# Patient Record
Sex: Male | Born: 1970 | Race: Black or African American | Hispanic: No | Smoking: Never smoker
Health system: Southern US, Community
[De-identification: ages and names within clinical notes are randomized; demographics above are authoritative.]

## PROBLEM LIST (undated history)

## (undated) DIAGNOSIS — I10 Essential (primary) hypertension: Secondary | ICD-10-CM

## (undated) DIAGNOSIS — C61 Malignant neoplasm of prostate: Secondary | ICD-10-CM

## (undated) DIAGNOSIS — E785 Hyperlipidemia, unspecified: Secondary | ICD-10-CM

## (undated) DIAGNOSIS — K219 Gastro-esophageal reflux disease without esophagitis: Secondary | ICD-10-CM

## (undated) HISTORY — PX: HAND SURGERY: SHX662

## (undated) HISTORY — PX: PROSTATE BIOPSY: SHX241

---

## 2007-05-18 ENCOUNTER — Emergency Department (HOSPITAL_COMMUNITY): Admission: EM | Admit: 2007-05-18 | Discharge: 2007-05-18 | Payer: Self-pay | Admitting: Emergency Medicine

## 2011-04-27 LAB — RAPID STREP SCREEN (MED CTR MEBANE ONLY): Streptococcus, Group A Screen (Direct): NEGATIVE

## 2014-09-06 ENCOUNTER — Emergency Department (HOSPITAL_COMMUNITY)
Admission: EM | Admit: 2014-09-06 | Discharge: 2014-09-06 | Disposition: A | Discharge status: Home / Self Care | Payer: Commercial Managed Care - PPO | Attending: Emergency Medicine | Admitting: Emergency Medicine

## 2014-09-06 ENCOUNTER — Emergency Department (HOSPITAL_COMMUNITY): Payer: Commercial Managed Care - PPO

## 2014-09-06 ENCOUNTER — Encounter (HOSPITAL_COMMUNITY): Payer: Self-pay | Admitting: *Deleted

## 2014-09-06 DIAGNOSIS — S91122A Laceration with foreign body of left great toe without damage to nail, initial encounter: Secondary | ICD-10-CM | POA: Diagnosis present

## 2014-09-06 DIAGNOSIS — I10 Essential (primary) hypertension: Secondary | ICD-10-CM | POA: Diagnosis not present

## 2014-09-06 DIAGNOSIS — W228XXA Striking against or struck by other objects, initial encounter: Secondary | ICD-10-CM | POA: Insufficient documentation

## 2014-09-06 DIAGNOSIS — Y9389 Activity, other specified: Secondary | ICD-10-CM | POA: Insufficient documentation

## 2014-09-06 DIAGNOSIS — Y9289 Other specified places as the place of occurrence of the external cause: Secondary | ICD-10-CM | POA: Insufficient documentation

## 2014-09-06 DIAGNOSIS — L84 Corns and callosities: Secondary | ICD-10-CM

## 2014-09-06 DIAGNOSIS — Z23 Encounter for immunization: Secondary | ICD-10-CM | POA: Diagnosis not present

## 2014-09-06 DIAGNOSIS — IMO0002 Reserved for concepts with insufficient information to code with codable children: Secondary | ICD-10-CM

## 2014-09-06 DIAGNOSIS — Y998 Other external cause status: Secondary | ICD-10-CM | POA: Diagnosis not present

## 2014-09-06 HISTORY — DX: Essential (primary) hypertension: I10

## 2014-09-06 IMAGING — DX DG TOE GREAT 2+V*L*
3 series · 3 of 3 positions shown · non-contrast
Comparison: None.

CLINICAL DATA: Splinter in laceration to left toe after tripping
and flipped lots on his dex

EXAM:
LEFT GREAT TOE

[toe ap]
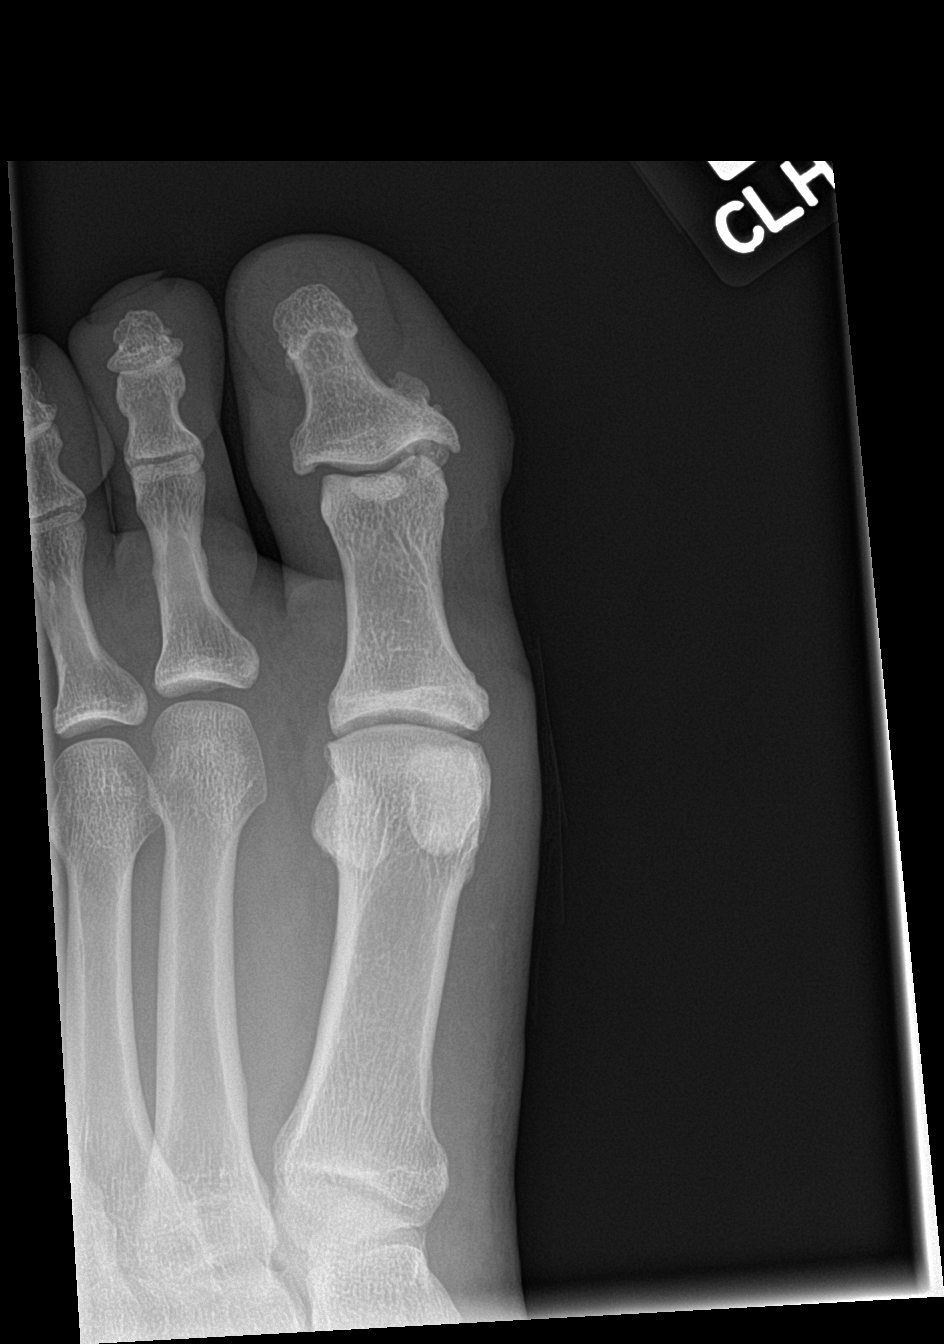

[toe obl]
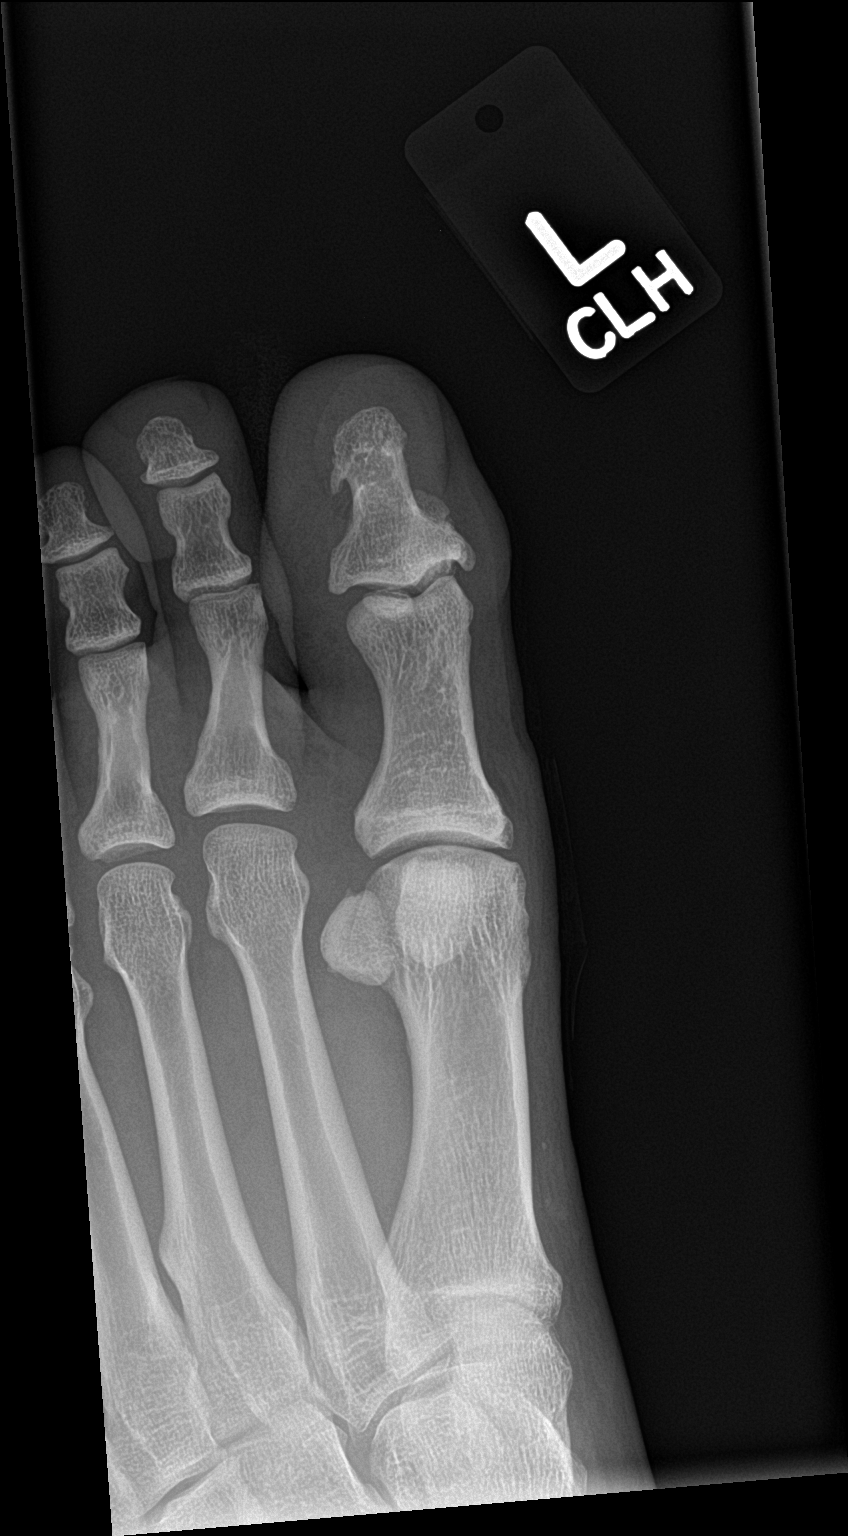

[toe lat]
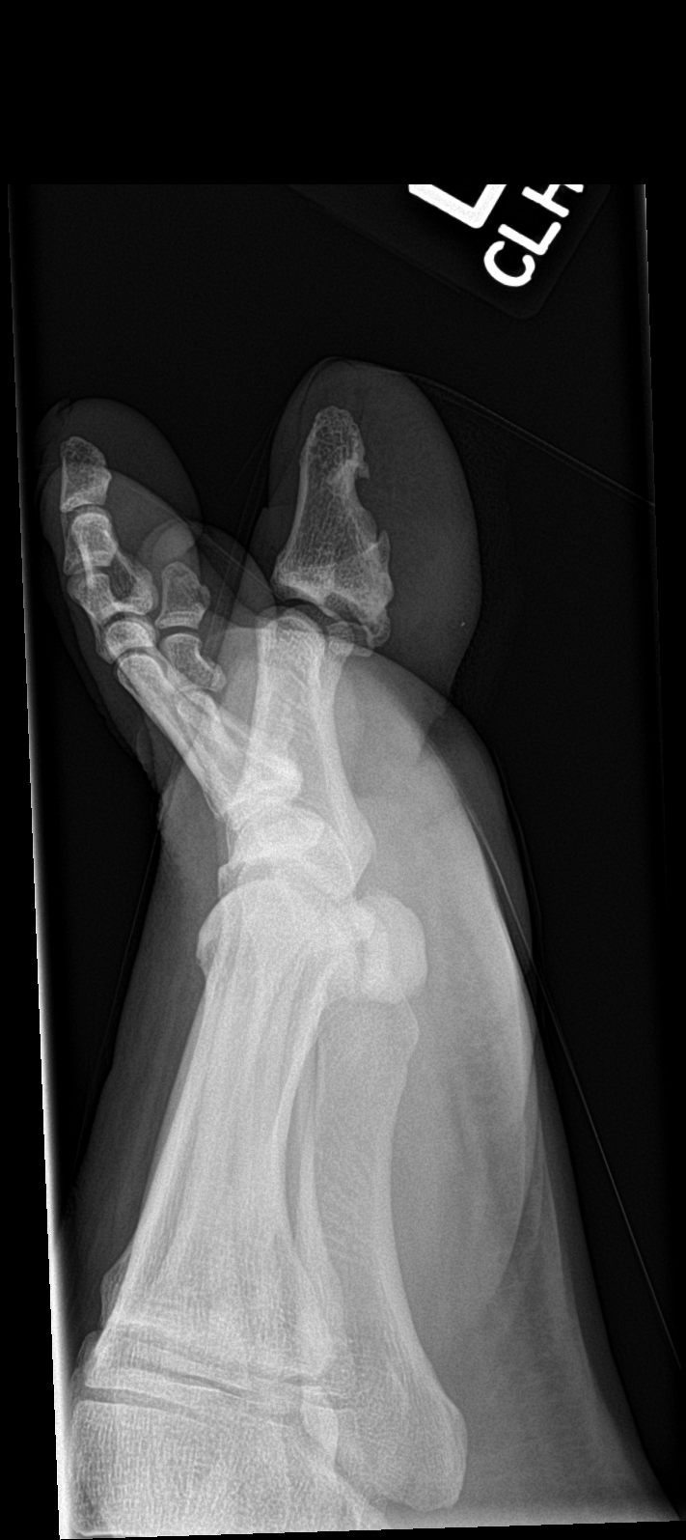

[3 of 3 positions shown; findings below may reference images not displayed]

FINDINGS: Negative for fracture, dislocation or radiopaque foreign body.
IMPRESSION: Negative.

## 2014-09-06 MED ORDER — CEPHALEXIN 500 MG PO CAPS: 500.0000 mg | ORAL_CAPSULE | Freq: Four times a day (QID) | ORAL | Status: DC

## 2014-09-06 MED ORDER — TETANUS-DIPHTH-ACELL PERTUSSIS 5-2.5-18.5 LF-MCG/0.5 IM SUSP
0.5000 mL | Freq: Once | INTRAMUSCULAR | Status: AC
  Administered 2014-09-06: 0.5 mL via INTRAMUSCULAR
  Filled 2014-09-06: qty 0.5

## 2014-09-06 MED ORDER — LIDOCAINE HCL (PF) 1 % IJ SOLN
5.0000 mL | Freq: Once | INTRAMUSCULAR | Status: AC
  Administered 2014-09-06: 5 mL via INTRADERMAL
  Filled 2014-09-06: qty 5

## 2014-09-06 MED ORDER — HYDROCODONE-ACETAMINOPHEN 5-325 MG PO TABS: 1.0000 | ORAL_TABLET | ORAL | Status: DC | PRN

## 2014-09-06 MED ORDER — NAPROXEN 500 MG PO TABS: 500.0000 mg | ORAL_TABLET | Freq: Two times a day (BID) | ORAL | Status: DC

## 2014-09-06 NOTE — ED Notes (Signed)
The pt is c/o a laceration l t great toe on a piece of wood 2200.  painful

## 2014-09-06 NOTE — ED Notes (Signed)
PA at bedside cleaning wound

## 2014-09-06 NOTE — ED Notes (Signed)
Patient transported to X-ray 

## 2014-09-06 NOTE — ED Provider Notes (Signed)
CSN: [id number]     Arrival date & time 09/06/14  0315 History   First MD Initiated Contact with Patient 09/06/14 [phone]     Chief Complaint  Patient presents with  . Laceration     (Consider location/radiation/quality/duration/timing/severity/associated sxs/prior Treatment) HPI  Pt is a 44yo male presenting to ED with c/o left great toe pain that started around 10PM last night after pt tripped on stairs on his wood back deck, resulting in a laceration to the bottom of his left great toe.  Pt states he was able to catch himself before he fell, no other injuries. Left great toe pain is constant, 9/10, aching, throbbing and burning. No relief with Bayer aspirin PTA.  Unknown last tetanus vaccine.   Past Medical History  Diagnosis Date  . Hypertension    No past surgical history on file. No family history on file. History  Substance Use Topics  . Smoking status: Never Smoker   . Smokeless tobacco: Not on file  . Alcohol Use: Yes    Review of Systems  Musculoskeletal: Negative for myalgias and arthralgias.  Skin: Positive for wound.       Bottom of left great toe  Neurological: Negative for weakness and numbness.  All other systems reviewed and are negative.     Allergies  Review of patient's allergies indicates no known allergies.  Home Medications   Prior to Admission medications   Medication Sig Start Date End Date Taking? Authorizing Provider  cephALEXin (KEFLEX) 500 MG capsule Take 1 capsule (500 mg total) by mouth 4 (four) times daily. 09/06/14   Noland Fordyce, PA-C  HYDROcodone-acetaminophen (NORCO/VICODIN) 5-325 MG per tablet Take 1-2 tablets by mouth every 4 (four) hours as needed for moderate pain or severe pain. 09/06/14   Noland Fordyce, PA-C  naproxen (NAPROSYN) 500 MG tablet Take 1 tablet (500 mg total) by mouth 2 (two) times daily. 09/06/14   Noland Fordyce, PA-C   BP 126/86 mmHg  Pulse 88  Temp(Src) 98.7 F (37.1 C)  Resp 18  Ht 6' (1.829 m)  Wt 238 lb  (107.956 kg)  BMI 32.27 kg/m2  SpO2 98% Physical Exam  Constitutional: He is oriented to person, place, and time. He appears well-developed and well-nourished.  HENT:  Head: Normocephalic and atraumatic.  Eyes: EOM are normal.  Neck: Normal range of motion.  Cardiovascular: Normal rate.   Left great toe: cap refill <3 seconds  Pulmonary/Chest: Effort normal.  Musculoskeletal: Normal range of motion.  Neurological: He is alert and oriented to person, place, and time.  Left great toe: sensation to light and sharp touch in tact  Skin: Skin is warm and dry. Laceration noted.  Plantar aspect of left great toe: 3cm irregular 'C' shaped superficial laceration with partial skin avulsion. Surrounding callused skin.  Scant red blood. Foreign bodies c/w wood splinters.  No evidence of puncture wound.  Psychiatric: He has a normal mood and affect. His behavior is normal.  Nursing note and vitals reviewed.   ED Course  Procedures   NERVE BLOCK Performed by: Noland Fordyce A. Consent: Verbal consent obtained. Required items: required blood products, implants, devices, and special equipment available Time out: Immediately prior to procedure a "time out" was called to verify the correct patient, procedure, equipment, support staff and site/side marked as required.  Indication: wound cleaning and inspection Nerve block body site: left great toe  Preparation: Patient was prepped and draped in the usual sterile fashion. Needle gauge: 24 G Location technique: anatomical landmarks  Local anesthetic: lidocaine 1% without epinephrine   Anesthetic total: 3 ml  Outcome: pain improved Patient tolerance: Patient tolerated the procedure well with no immediate complications.   The wound is cleansed, debrided of foreign material as much as possible, bacitracin ointment and dressing applied. The patient is alerted to watch for any signs of infection (redness, pus, pain, increased swelling or fever) and  call if such occurs. Home wound care instructions are provided. Tetanus vaccination status reviewed: pt unsure, Tdap given in ED today.    Labs Review Labs Reviewed - No data to display  Imaging Review Dg Toe Great Left  09/06/2014   CLINICAL DATA:  Splinter in laceration to left toe after tripping and flipped lots on his dex  EXAM: LEFT GREAT TOE  COMPARISON:  None.  FINDINGS: Negative for fracture, dislocation or radiopaque foreign body.  IMPRESSION: Negative.   Electronically Signed   By: Andreas Newport M.D.   On: 09/06/2014 06:52     EKG Interpretation None      MDM   Final diagnoses:  Laceration of left great toe with foreign body without damage to nail, initial encounter  Foot callus    Pt presenting to ED with laceration to plantar aspect of left great toe. No other injuries. Left great toe is neurovascularly in tact.  Wound considered dirty due to small pieces of wood removed and washed away during inspection and cleaning of wound.  Edges of wound would not approximate, appears to have partial skin avulsion. Bacitracin ointment applied and pressure bandage placed. Wound care instructions provided. Rx: keflex, norco and naproxen. Advised to f/u with PCP for wound check if not improving. Return precautions provided. Pt verbalized understanding and agreement with tx plan. Work note provided.   Noland Fordyce, PA-C 09/06/14 [phone]

## 2014-09-06 NOTE — ED Provider Notes (Signed)
Medical screening examination/treatment/procedure(s) were conducted as a shared visit with non-physician practitioner(s) and myself.  I personally evaluated the patient during the encounter.   EKG Interpretation None       Pt with toe laceration. Cut himself on his deck. Not diabetic. Tdap given. Erin to evaluate the laceration to toe carefully, and irrigate the wound. Pt might need stitches. tdap given.  Varney Biles, MD 09/06/14 [phone]

## 2018-08-15 ENCOUNTER — Encounter: Payer: Self-pay | Admitting: Sports Medicine

## 2018-08-21 ENCOUNTER — Ambulatory Visit: Payer: 59 | Admitting: Sports Medicine

## 2018-08-21 VITALS — BP 110/66 | Ht 73.0 in | Wt 240.0 lb

## 2018-08-21 DIAGNOSIS — M2141 Flat foot [pes planus] (acquired), right foot: Secondary | ICD-10-CM

## 2018-08-21 DIAGNOSIS — M2142 Flat foot [pes planus] (acquired), left foot: Secondary | ICD-10-CM

## 2018-08-22 ENCOUNTER — Encounter: Payer: Self-pay | Admitting: Sports Medicine

## 2018-08-22 NOTE — Progress Notes (Signed)
   Subjective:    Patient ID: Jesus Hamilton, male    DOB: 02-12-1971, 48 y.o.   MRN: [id number]  HPI chief complaint: Bilateral foot and ankle pain  Very pleasant 48 year old male comes in today at the request of [facility] for custom orthotics.  He was recently seen in their office and diagnosed with arthritis.  They were thinking that custom orthotics might be helpful.  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  He has not had custom orthotics in the past.   Review of Systems As above    Objective:   Physical Exam  Well-developed, well-nourished.  No acute distress.  Awake alert and oriented x3.  Vital signs reviewed  Examination of both feet in the standing position shows mild pes planus.  No soft tissue swelling.  No tenderness to palpation.  Normal skin color.  Good pulses.  Pronation with walking.  No limp.      Assessment & Plan:   Pes planus  Custom orthotics were created for this patient today.  He found them to be comfortable prior to leaving the office.  Total of 30 minutes was spent with the patient with greater than 50% of the time spent in face-to-face consultation discussing orthotic construction, instruction, and fitting.  Pronation was well corrected with orthotics in place.  He will try these custom orthotics in his work boots as well.  If they do not fit well that he may return to the office with his work boots for a second set of orthotics at his convenience.  Otherwise, follow-up as needed.  Patient was fitted for a : standard, cushioned, semi-rigid orthotic. The orthotic was heated and afterward the patient stood on the orthotic blank positioned on the orthotic stand. The patient was positioned in subtalar neutral position and 10 degrees of ankle dorsiflexion in a weight bearing stance. After completion of molding, a stable base was applied to the orthotic blank. The blank was ground to a stable position for weight  bearing. Size: 13 Base: Blue EVA Posting: none Additional orthotic padding: none

## 2020-11-23 ENCOUNTER — Encounter: Payer: Self-pay | Admitting: Gastroenterology

## 2020-12-24 ENCOUNTER — Other Ambulatory Visit (HOSPITAL_COMMUNITY): Payer: Self-pay | Admitting: Urology

## 2020-12-24 DIAGNOSIS — C61 Malignant neoplasm of prostate: Secondary | ICD-10-CM

## 2020-12-31 ENCOUNTER — Encounter (HOSPITAL_COMMUNITY)
Admission: RE | Admit: 2020-12-31 | Discharge: 2020-12-31 | Disposition: A | Discharge status: Home / Self Care | Payer: 59 | Source: Ambulatory Visit | Attending: Urology | Admitting: Urology

## 2020-12-31 ENCOUNTER — Other Ambulatory Visit: Payer: Self-pay

## 2020-12-31 ENCOUNTER — Other Ambulatory Visit: Payer: Self-pay | Admitting: Urology

## 2020-12-31 ENCOUNTER — Other Ambulatory Visit (HOSPITAL_COMMUNITY): Payer: Self-pay | Admitting: Urology

## 2020-12-31 DIAGNOSIS — C61 Malignant neoplasm of prostate: Secondary | ICD-10-CM | POA: Insufficient documentation

## 2020-12-31 DIAGNOSIS — D136 Benign neoplasm of pancreas: Secondary | ICD-10-CM

## 2020-12-31 IMAGING — NM NM BONE WHOLE BODY
5 series · 20 of 20 positions shown · non-contrast
Comparison: CT AP [DATE].

CLINICAL DATA: Prostate cancer with low back pain for 2 years.

EXAM:
NUCLEAR MEDICINE WHOLE BODY BONE SCAN
TECHNIQUE: Whole body anterior and posterior images were obtained approximately
3 hours after intravenous injection of radiopharmaceutical.
RADIOPHARMACEUTICALS:  21.3 mCi [KC] MDP IV

[Series 1: whole body · 2.66mm/px · 1 of 1 slices shown (1 of 2)]
[im 1/1]
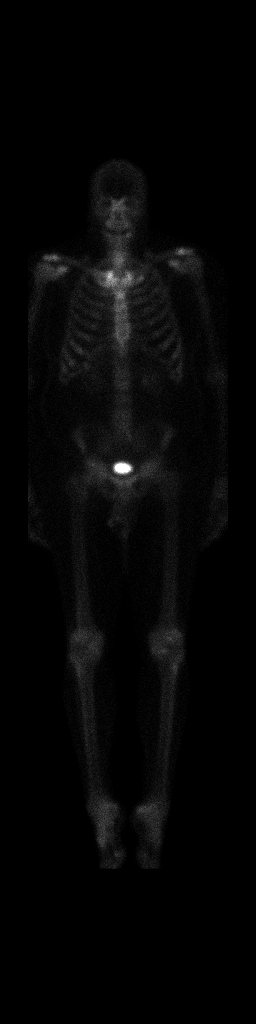

[Series 1: spect - (id)_(id)_cor · 4.1mm · 4.14mm/px · 6 of 128 frames shown]
[frame 11/128]
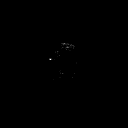
[frame 32/128]
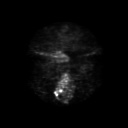
[frame 54/128]
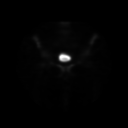
[frame 75/128]
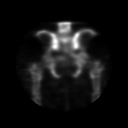
[frame 96/128]
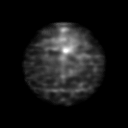
[frame 118/128]
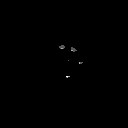

[Series 1: whole body · 2.66mm/px · 1 of 1 slices shown (2 of 2)]
[im 1/1]
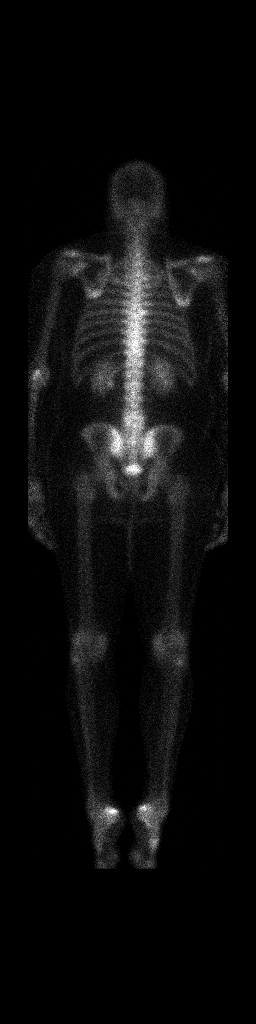

[Series 1: spect - (id)_(id)_tra · 4.1mm · 4.14mm/px · 6 of 128 frames shown]
[frame 11/128]
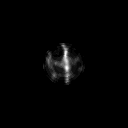
[frame 32/128]
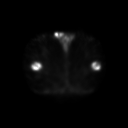
[frame 54/128]
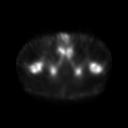
[frame 75/128]
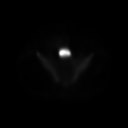
[frame 96/128]
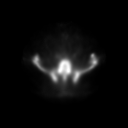
[frame 118/128]
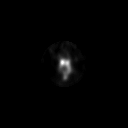

[Series 2: spect parathyroid · 4.14mm/px · 6 of 64 frames shown]
[frame 6/64]
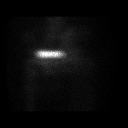
[frame 16/64]
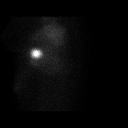
[frame 27/64]
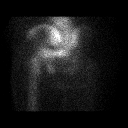
[frame 38/64]
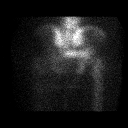
[frame 48/64]
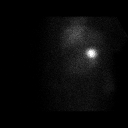
[frame 59/64]
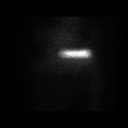

[20 of 20 positions shown; findings below may reference images not displayed]

FINDINGS: No abnormal foci of increased radiotracer uptake specific for bone
metastases. There are mild degenerative changes noted within the
shoulders, sternoclavicular joints, and knees. Normal physiologic
tracer activity seen within the kidneys an urinary bladder.
IMPRESSION: 1. No findings to suggest bone metastases.

## 2020-12-31 MED ORDER — TECHNETIUM TC 99M MEDRONATE IV KIT
21.3000 | PACK | Freq: Once | INTRAVENOUS | Status: AC | PRN
  Administered 2020-12-31: 21.3 via INTRAVENOUS

## 2021-01-07 ENCOUNTER — Encounter (HOSPITAL_COMMUNITY): Payer: Commercial Managed Care - PPO

## 2021-01-07 ENCOUNTER — Other Ambulatory Visit (HOSPITAL_COMMUNITY): Payer: Commercial Managed Care - PPO

## 2021-01-07 ENCOUNTER — Other Ambulatory Visit (HOSPITAL_COMMUNITY): Payer: Self-pay | Admitting: Urology

## 2021-01-07 DIAGNOSIS — D136 Benign neoplasm of pancreas: Secondary | ICD-10-CM

## 2021-01-08 ENCOUNTER — Ambulatory Visit (HOSPITAL_COMMUNITY)
Admission: RE | Admit: 2021-01-08 | Discharge: 2021-01-08 | Disposition: A | Discharge status: Home / Self Care | Payer: 59 | Source: Ambulatory Visit | Attending: Urology | Admitting: Urology

## 2021-01-08 DIAGNOSIS — D136 Benign neoplasm of pancreas: Secondary | ICD-10-CM

## 2021-01-08 IMAGING — MR MR ABDOMEN WO/W CM
13 of 17 series · 35 of 48 positions shown · IV contrast (gadavist)
Comparison: CT abdomen pelvis, [DATE]

CLINICAL DATA: Pancreatic cystic lesion identified by prior CT,
history of prostate cancer, abnormal lab work

EXAM:
MRI ABDOMEN WITHOUT AND WITH CONTRAST
TECHNIQUE: Multiplanar multisequence MR imaging of the abdomen was performed
both before and after the administration of intravenous contrast.
CONTRAST:  10mL GADAVIST GADOBUTROL 1 MMOL/ML IV SOLN

[Series 3: T2 · coronal · 7.0mm · 1.48mm/px · 1 of 30 slices shown (1 of 2)]
[im 1/30]
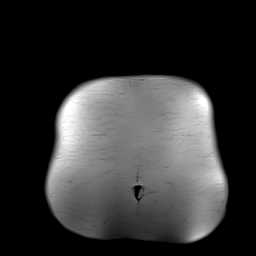

[Series 4: T2 fat-sat · axial · 6.0mm · 1.16mm/px · 1 of 36 slices shown]
[im 1/36]
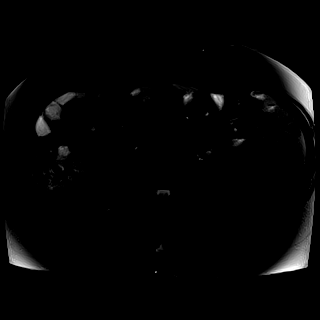

[Series 6: cor obl thk · sagittal · 50.0mm · 0.78mm/px · 1 of 9 slices shown]
[im 1/9]
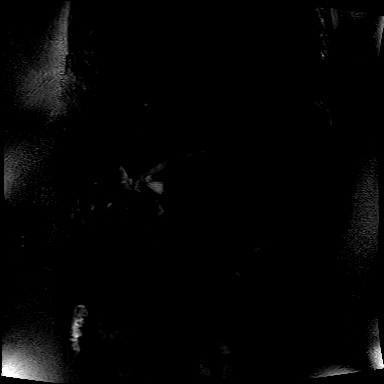

[Series 8: cor_3d_spc_trig · coronal · 1.0mm · 0.49mm/px · 4 of 88 slices shown]
[im 1/88]
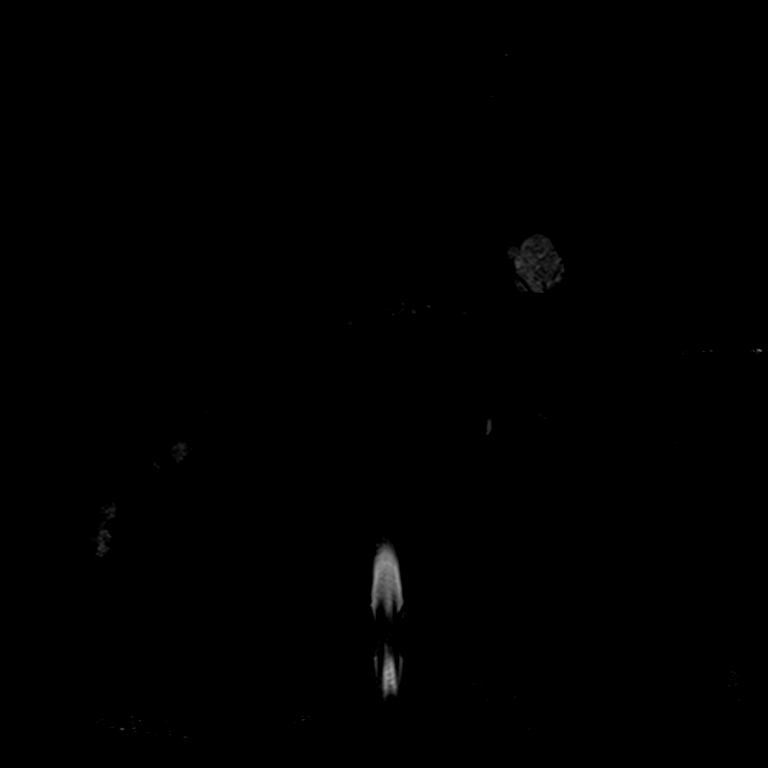
[im 30/88]
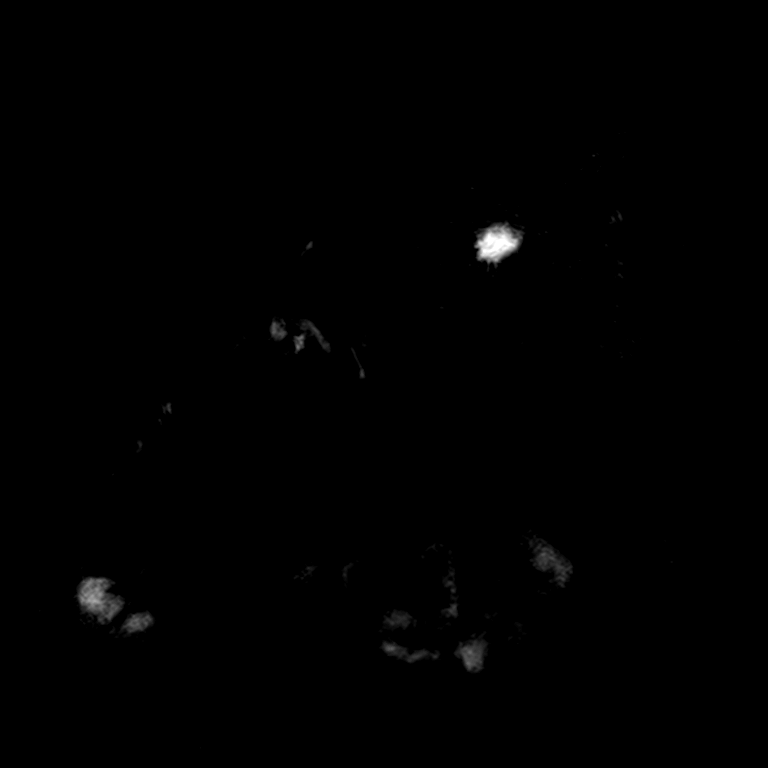
[im 59/88]
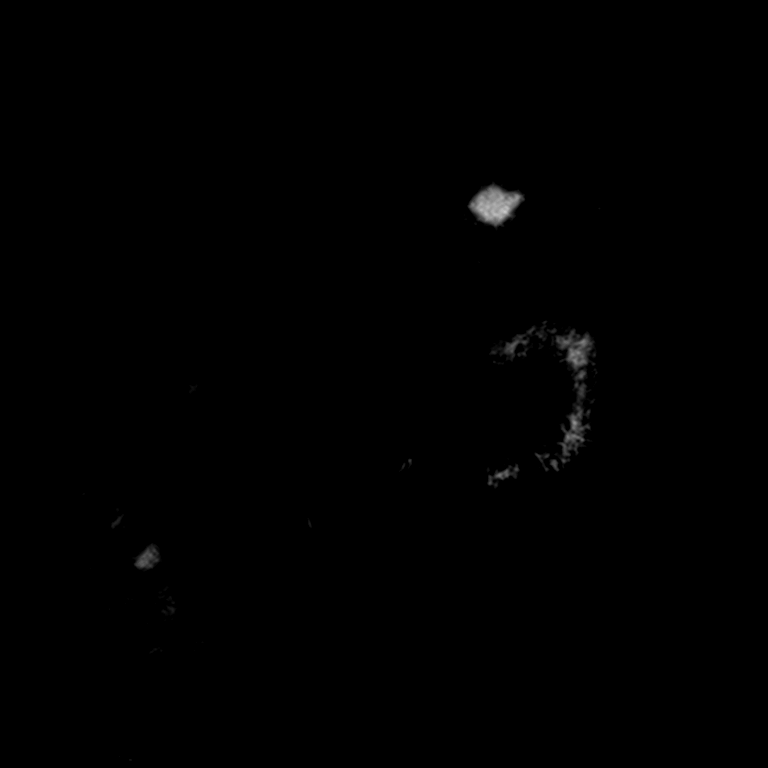
[im 88/88]
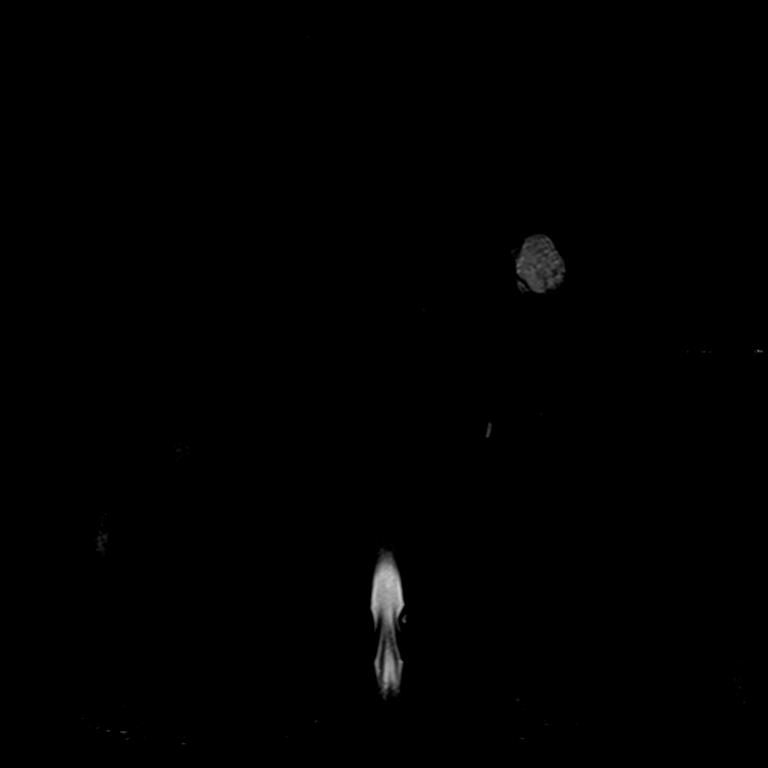

[Series 10: T1 · axial · 3.1mm · 1.19mm/px · z∈[-84,+161]mm · 4 of 80 slices shown (1 of 2)]
[im 1/80]
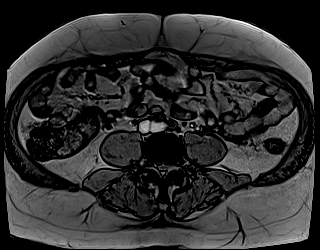
[im 27/80]
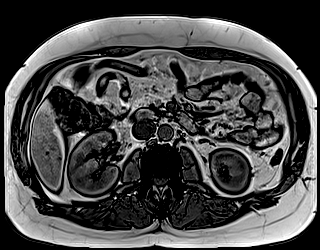
[im 53/80]
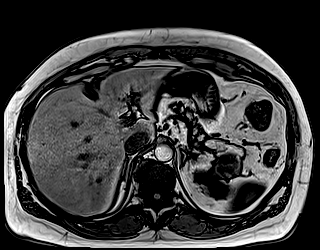
[im 80/80]
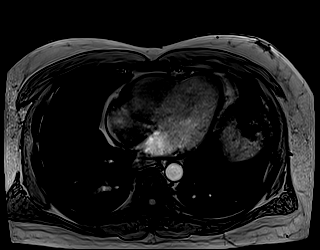

[Series 11: T1 · axial · 3.1mm · 1.19mm/px · z∈[-84,+161]mm · 4 of 80 slices shown (2 of 2)]
[im 1/80]
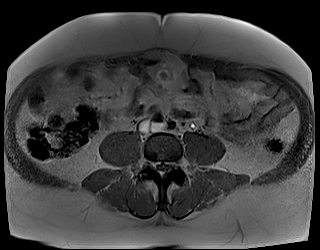
[im 27/80]
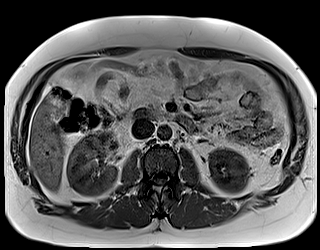
[im 53/80]
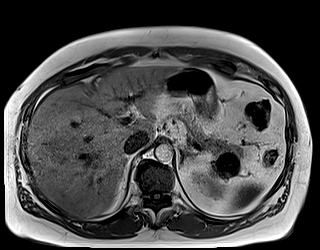
[im 80/80]
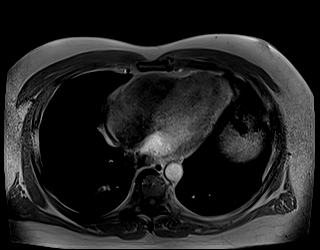

[Series 12: DWI · axial · 6.0mm · 1.49mm/px · z∈[-68,+184]mm · 3 of 72 slices shown (1 of 2)]
[im 1/72]
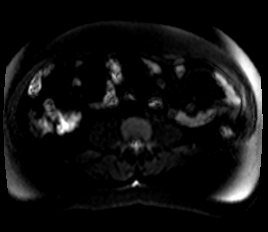
[im 36/72]
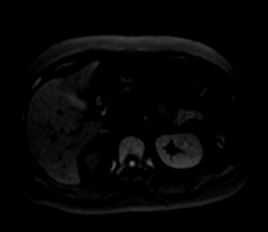
[im 72/72]
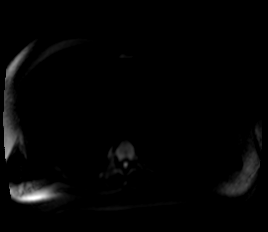

[Series 13: DWI · axial · 6.0mm · 1.49mm/px · z∈[-68,+184]mm · 2 of 36 slices shown (2 of 2)]
[im 1/36]
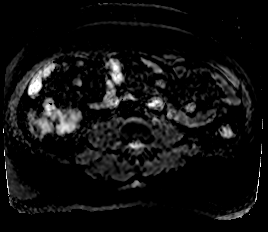
[im 36/36]
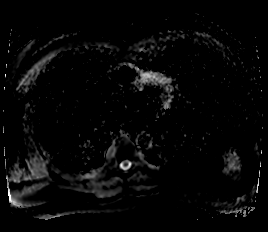

[Series 14: T2 · axial · 6.0mm · 1.48mm/px · z∈[-78,+160]mm · 2 of 34 slices shown (2 of 2)]
[im 1/34]
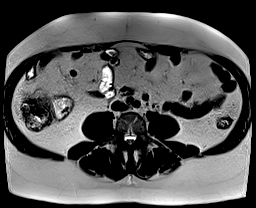
[im 34/34]
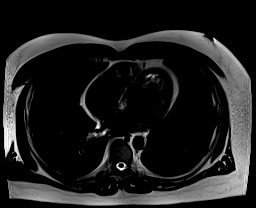

[Series 16: bSSFP · axial · 7.0mm · 1.19mm/px · 1 of 30 slices shown]
[im 1/30]
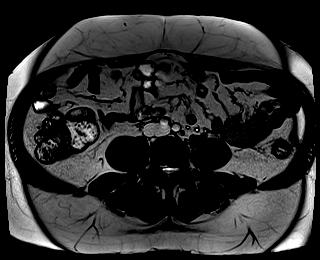

[Series 19: T1 dynamic · axial · 3.0mm · 1.19mm/px · z∈[-71,+166]mm · 4 of 80 slices shown (1 of 3)]
[im 1/80]
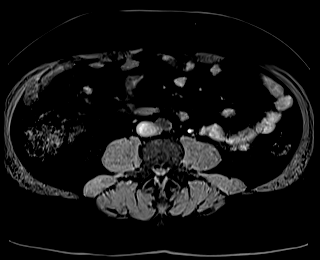
[im 27/80]
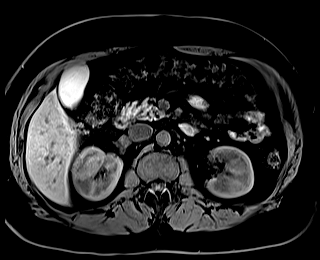
[im 53/80]
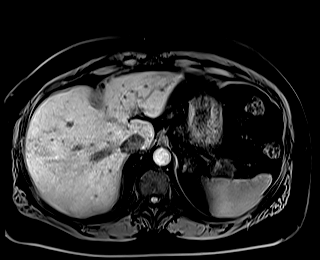
[im 80/80]
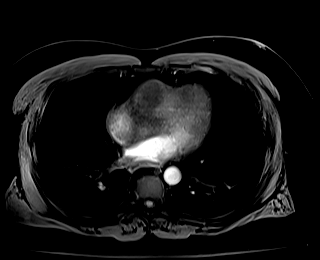

[Series 22: T1 dynamic · axial · 3.0mm · 1.19mm/px · z∈[-71,+166]mm · 4 of 80 slices shown (2 of 3)]
[im 1/80]
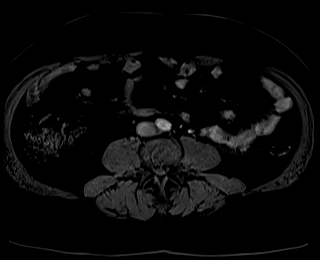
[im 27/80]
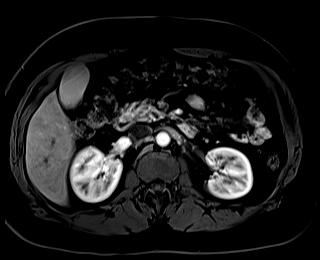
[im 53/80]
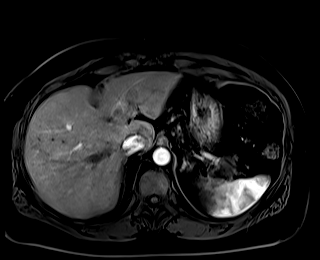
[im 80/80]
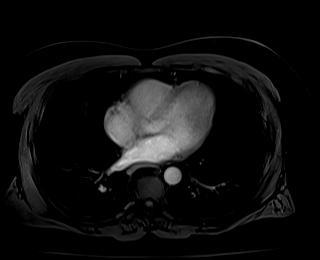

[Series 24: T1 dynamic · axial · 3.0mm · 1.19mm/px · z∈[-71,+166]mm · 4 of 80 slices shown (3 of 3)]
[im 1/80]
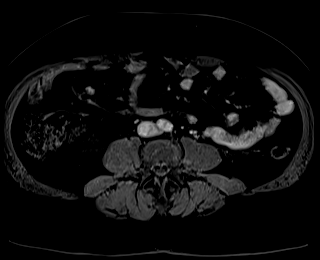
[im 27/80]
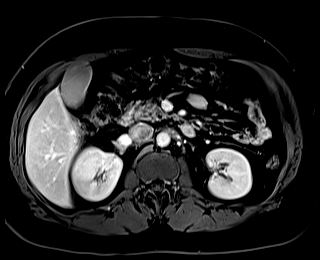
[im 53/80]
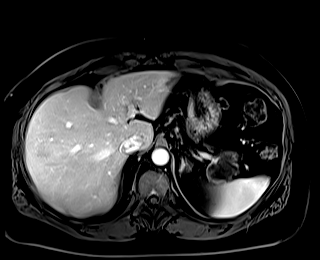
[im 80/80]
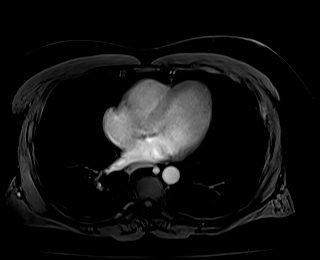

[35 of 48 positions shown; findings below may reference images not displayed]

FINDINGS: Lower chest: No acute findings.

Hepatobiliary: No mass or other parenchymal abnormality identified.

Pancreas: There is a relatively thick-walled, thinly septated cystic
lesion of the posterior tip of the pancreatic tail measuring 3.8 x
3.1 cm (series 4, image 14). There is no appreciable associated
contrast enhancement. No solid mass, inflammatory changes, or other
parenchymal abnormality identified. No pancreatic ductal dilatation.

Spleen:  Within normal limits in size and appearance.

Adrenals/Urinary Tract: No masses identified. No evidence of
hydronephrosis.

Stomach/Bowel: Visualized portions within the abdomen are
unremarkable.

Vascular/Lymphatic: No pathologically enlarged lymph nodes
identified. No abdominal aortic aneurysm demonstrated.

Other:  None.

Musculoskeletal: No suspicious bone lesions identified.
IMPRESSION: There is a relatively thick-walled, thinly septated cystic lesion of
the posterior tip of the pancreatic tail measuring 3.8 x 3.1 cm.
There is no appreciable associated contrast enhancement. No solid
mass, inflammatory changes, or other parenchymal abnormality
identified. Differential considerations again include pancreatic
cyst or cystic neoplasm such as IPMN. Given size and wall thickness,
recommend EUS/FNA and/or surgical consultation.

## 2021-01-08 MED ORDER — GADOBUTROL 1 MMOL/ML IV SOLN
10.0000 mL | Freq: Once | INTRAVENOUS | Status: AC | PRN
  Administered 2021-01-08: 10 mL via INTRAVENOUS

## 2021-01-29 ENCOUNTER — Encounter: Payer: Commercial Managed Care - PPO | Admitting: Gastroenterology

## 2021-02-02 ENCOUNTER — Other Ambulatory Visit: Payer: Self-pay | Admitting: Urology

## 2021-02-05 ENCOUNTER — Other Ambulatory Visit: Payer: Self-pay | Admitting: Urology

## 2021-02-05 DIAGNOSIS — C61 Malignant neoplasm of prostate: Secondary | ICD-10-CM

## 2021-02-23 NOTE — Patient Instructions (Addendum)
DUE TO COVID-19 ONLY ONE VISITOR IS ALLOWED TO COME WITH YOU AND STAY IN THE WAITING ROOM ONLY DURING PRE OP AND PROCEDURE.   **NO VISITORS ARE ALLOWED IN THE SHORT STAY AREA OR RECOVERY ROOM!!**  IF YOU WILL BE ADMITTED INTO THE HOSPITAL YOU ARE ALLOWED ONLY TWO SUPPORT PEOPLE DURING VISITATION HOURS ONLY (10AM -8PM)   The support person(s) may change daily. The support person(s) must pass our screening, gel in and out, and wear a mask at all times, including in the patient's room. Patients must also wear a mask when staff or their support person are in the room.  No visitors under the age of 5. Any visitor under the age of 73 must be accompanied by an adult.    COVID SWAB TESTING MUST BE COMPLETED ON:  Monday 03-15-21 between 8 and 3  **MUST PRESENT COMPLETED FORM AT TESTING SITE**    Jesus Hamilton (backside of the building)  You are not required to quarantine, however you are required to wear a well-fitted mask when you are out and around people not in your household.  Hand Hygiene often Do NOT share personal items Notify your provider if you are in close contact with someone who has COVID or you develop fever 100.4 or greater, new onset of sneezing, cough, sore throat, shortness of breath or body aches.         Your procedure is scheduled on:  Wednesday, 03-17-21   Report to [facility] Main  Entrance   Report to Short Stay at 5:15 AM   [facility])    Call this number if you have problems the morning of surgery [phone]   Complete one Fleet Enema the morning of surgery (purchase at the [facility])   Do not eat food :After Midnight.   May have liquids until 4:15 AM  day of surgery  CLEAR LIQUID DIET  Foods Allowed                                                                     Foods Excluded  Water, Black Coffee and tea, regular and decaf              liquids that you cannot  Plain Jell-O in any flavor  (No red)                                    see through such as: Fruit ices (not with fruit pulp)                                           milk, soups, orange juice              Iced Popsicles (No red)                                      All solid food  Apple juices Sports drinks like Gatorade (No red) Lightly seasoned clear broth or consume(fat free) Sugar, honey syrup     Oral Hygiene is also important to reduce your risk of infection.                                    Remember - BRUSH YOUR TEETH THE MORNING OF SURGERY WITH YOUR REGULAR TOOTHPASTE   Do NOT smoke after Midnight   Take these medicines the morning of surgery with A SIP OF WATER:  Atorvastatin                               You may not have any metal on your body including  jewelry, and body piercing             Do not wear  lotions, powders, cologne, or deodorant               Men may shave face and neck.   Do not bring valuables to the hospital. [place].   Contacts, dentures or bridgework may not be worn into surgery.   Bring small overnight bag day of surgery.    Please read over the following fact sheets you were given: IF YOU HAVE QUESTIONS ABOUT YOUR PRE OP INSTRUCTIONS PLEASE CALL [facility] - Preparing for Surgery Before surgery, you can play an important role.  Because skin is not sterile, your skin needs to be as free of germs as possible.  You can reduce the number of germs on your skin by washing with CHG (chlorahexidine gluconate) soap before surgery.  CHG is an antiseptic cleaner which kills germs and bonds with the skin to continue killing germs even after washing. Please DO NOT use if you have an allergy to CHG or antibacterial soaps.  If your skin becomes reddened/irritated stop using the CHG and inform your nurse when you arrive at Short Stay. Do not shave (including legs and underarms) for at least 48 hours prior to the first CHG  shower.  You may shave your face/neck.  Please follow these instructions carefully:  1.  Shower with CHG Soap the night before surgery and the  morning of surgery.  2.  If you choose to wash your hair, wash your hair first as usual with your normal  shampoo.  3.  After you shampoo, rinse your hair and body thoroughly to remove the shampoo.                             4.  Use CHG as you would any other liquid soap.  You can apply chg directly to the skin and wash.  Gently with a scrungie or clean washcloth.  5.  Apply the CHG Soap to your body ONLY FROM THE NECK DOWN.   Do   not use on face/ open                           Wound or open sores. Avoid contact with eyes, ears mouth and   genitals (private parts).                       Wash face,  Development worker, international aid (private  parts) with your normal soap.             6.  Wash thoroughly, paying special attention to the area where your    surgery  will be performed.  7.  Thoroughly rinse your body with warm water from the neck down.  8.  DO NOT shower/wash with your normal soap after using and rinsing off the CHG Soap.                9.  Pat yourself dry with a clean towel.            10.  Wear clean pajamas.            11.  Place clean sheets on your bed the night of your first shower and do not  sleep with pets. Day of Surgery : Do not apply any lotions/deodorants the morning of surgery.  Please wear clean clothes to the hospital/surgery center.  FAILURE TO FOLLOW THESE INSTRUCTIONS MAY RESULT IN THE CANCELLATION OF YOUR SURGERY  PATIENT SIGNATURE_________________________________  NURSE SIGNATURE__________________________________  ________________________________________________________________________  WHAT IS A BLOOD TRANSFUSION? Blood Transfusion Information  A transfusion is the replacement of blood or some of its parts. Blood is made up of multiple cells which provide different functions. Red blood cells carry oxygen and are used for blood loss  replacement. White blood cells fight against infection. Platelets control bleeding. Plasma helps clot blood. Other blood products are available for specialized needs, such as hemophilia or other clotting disorders. BEFORE THE TRANSFUSION  Who gives blood for transfusions?  Healthy volunteers who are fully evaluated to make sure their blood is safe. This is blood bank blood. Transfusion therapy is the safest it has ever been in the practice of medicine. Before blood is taken from a donor, a complete history is taken to make sure that person has no history of diseases nor engages in risky social behavior (examples are intravenous drug use or sexual activity with multiple partners). The donor's travel history is screened to minimize risk of transmitting infections, such as malaria. The donated blood is tested for signs of infectious diseases, such as HIV and hepatitis. The blood is then tested to be sure it is compatible with you in order to minimize the chance of a transfusion reaction. If you or a relative donates blood, this is often done in anticipation of surgery and is not appropriate for emergency situations. It takes many days to process the donated blood. RISKS AND COMPLICATIONS Although transfusion therapy is very safe and saves many lives, the main dangers of transfusion include:  Getting an infectious disease. Developing a transfusion reaction. This is an allergic reaction to something in the blood you were given. Every precaution is taken to prevent this. The decision to have a blood transfusion has been considered carefully by your caregiver before blood is given. Blood is not given unless the benefits outweigh the risks. AFTER THE TRANSFUSION Right after receiving a blood transfusion, you will usually feel much better and more energetic. This is especially true if your red blood cells have gotten low (anemic). The transfusion raises the level of the red blood cells which carry oxygen, and  this usually causes an energy increase. The nurse administering the transfusion will monitor you carefully for complications. HOME CARE INSTRUCTIONS  No special instructions are needed after a transfusion. You may find your energy is better. Speak with your caregiver about any limitations on activity for underlying diseases you may have. [place]  IF:  Your condition is not improving after your transfusion. You develop redness or irritation at the intravenous (IV) site. SEEK IMMEDIATE MEDICAL CARE IF:  Any of the following symptoms occur over the next 12 hours: Shaking chills. You have a temperature by mouth above 102 F (38.9 C), not controlled by medicine. Chest, back, or muscle pain. People around you feel you are not acting correctly or are confused. Shortness of breath or difficulty breathing. Dizziness and fainting. You get a rash or develop hives. You have a decrease in urine output. Your urine turns a dark color or changes to pink, red, or brown. Any of the following symptoms occur over the next 10 days: You have a temperature by mouth above 102 F (38.9 C), not controlled by medicine. Shortness of breath. Weakness after normal activity. The white part of the eye turns yellow (jaundice). You have a decrease in the amount of urine or are urinating less often. Your urine turns a dark color or changes to pink, red, or brown. Document Released: 07/01/2000 Document Revised: 09/26/2011 Document Reviewed: 02/18/2008 [facility] Patient Information 2014 [place], [place].  _______________________________________________________________________

## 2021-02-23 NOTE — Progress Notes (Addendum)
COVID swab appointment:  03-15-21  COVID Vaccine Completed:  Yes x3 Date COVID Vaccine completed:  12-14-19 01-04-20 Has received booster:07-31-20 COVID vaccine manufacturer: Pfizer      Date of COVID positive in last 90 days:  No  PCP - Cathlean Cower, MD Cardiologist -  N/A  Chest x-ray - N/A EKG - 02-26-21 Epic Stress Test - N/A ECHO - N/A Cardiac Cath - N/A Pacemaker/ICD device last checked: Spinal Cord Stimulator:  Sleep Study - N/A CPAP -   Fasting Blood Sugar - N/A Checks Blood Sugar _____ times a day  Blood Thinner Instructions: N/A Aspirin Instructions: Last Dose:  Activity level:  Can go up a flight of stairs and perform activities of daily living without stopping and without symptoms of chest pain or shortness of breath.  Able to exercise without symptoms    Anesthesia review:  N/A  Patient denies shortness of breath, fever, cough and chest pain at PAT appointment   Patient verbalized understanding of instructions that were given to them at the PAT appointment. Patient was also instructed that they will need to review over the PAT instructions again at home before surgery.

## 2021-02-26 ENCOUNTER — Other Ambulatory Visit: Payer: Self-pay

## 2021-02-26 ENCOUNTER — Encounter (HOSPITAL_COMMUNITY): Payer: Self-pay

## 2021-02-26 ENCOUNTER — Encounter (HOSPITAL_COMMUNITY)
Admission: RE | Admit: 2021-02-26 | Discharge: 2021-02-26 | Disposition: A | Discharge status: Home / Self Care | Payer: 59 | Source: Ambulatory Visit | Attending: Urology | Admitting: Urology

## 2021-02-26 DIAGNOSIS — Z01818 Encounter for other preprocedural examination: Secondary | ICD-10-CM | POA: Diagnosis not present

## 2021-02-26 HISTORY — DX: Malignant neoplasm of prostate: C61

## 2021-02-26 HISTORY — DX: Gastro-esophageal reflux disease without esophagitis: K21.9

## 2021-02-26 HISTORY — DX: Hyperlipidemia, unspecified: E78.5

## 2021-02-26 LAB — CBC
HCT: 42.4 % (ref 39.0–52.0)
Hemoglobin: 13.8 g/dL (ref 13.0–17.0)
MCH: 28.5 pg (ref 26.0–34.0)
MCHC: 32.5 g/dL (ref 30.0–36.0)
MCV: 87.4 fL (ref 80.0–100.0)
Platelets: 277 10*3/uL (ref 150–400)
RBC: 4.85 MIL/uL (ref 4.22–5.81)
RDW: 13.8 % (ref 11.5–15.5)
WBC: 7.2 10*3/uL (ref 4.0–10.5)
nRBC: 0 % (ref 0.0–0.2)

## 2021-02-26 LAB — COMPREHENSIVE METABOLIC PANEL
ALT: 27 U/L (ref 0–44)
AST: 19 U/L (ref 15–41)
Albumin: 3.9 g/dL (ref 3.5–5.0)
Alkaline Phosphatase: 54 U/L (ref 38–126)
Anion gap: 6 (ref 5–15)
BUN: 13 mg/dL (ref 6–20)
CO2: 30 mmol/L (ref 22–32)
Calcium: 9.1 mg/dL (ref 8.9–10.3)
Chloride: 105 mmol/L (ref 98–111)
Creatinine, Ser: 0.75 mg/dL (ref 0.61–1.24)
GFR, Estimated: >60 mL/min (ref >60)
Glucose, Bld: 95 mg/dL (ref 70–99)
Potassium: 3.9 mmol/L (ref 3.5–5.1)
Sodium: 141 mmol/L (ref 135–145)
Total Bilirubin: 0.7 mg/dL (ref 0.3–1.2)
Total Protein: 7.4 g/dL (ref 6.5–8.1)

## 2021-02-27 ENCOUNTER — Ambulatory Visit
Admission: RE | Admit: 2021-02-27 | Discharge: 2021-02-27 | Disposition: A | Discharge status: Home / Self Care | Payer: 59 | Source: Ambulatory Visit | Attending: Urology | Admitting: Urology

## 2021-02-27 DIAGNOSIS — C61 Malignant neoplasm of prostate: Secondary | ICD-10-CM

## 2021-02-27 LAB — URINE CULTURE: Culture: NO GROWTH

## 2021-02-27 IMAGING — MR MR PROSTATE WO/W CM
12 series · 48 of 48 positions shown · IV contrast (multihance)
Comparison: CT pelvis [DATE]

CLINICAL DATA: PSA of 20.40. Widespread adenocarcinoma up to
Gleason 4+3=7 bilaterally on the biopsy of [DATE]

EXAM:
MR PROSTATE WITHOUT AND WITH CONTRAST
TECHNIQUE: Multiplanar multisequence MRI images were obtained of the pelvis
centered about the prostate. Pre and post contrast images were
obtained.
CONTRAST:  20mL MULTIHANCE GADOBENATE DIMEGLUMINE 529 MG/ML IV SOLN

[Series 3: T2 · coronal · 3.0mm · 0.56mm/px · 1 of 23 slices shown (1 of 3)]
[im 1/23]
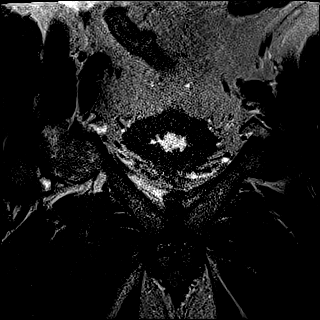

[Series 4: T1 · axial · 5.0mm · 1.25mm/px · z∈[-301,-46]mm · 2 of 104 slices shown]
[im 1/104]
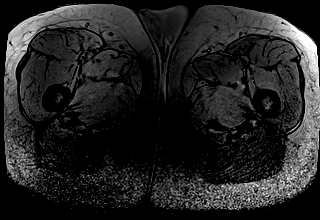
[im 104/104]
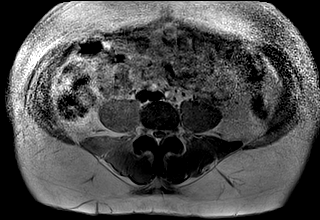

[Series 5: DWI · axial · 3.0mm · 1.75mm/px · 1 of 72 slices shown (1 of 3)]
[im 1/72]
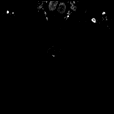

[Series 6: DWI · axial · 3.0mm · 1.75mm/px · 1 of 24 slices shown (2 of 3)]
[im 1/24]
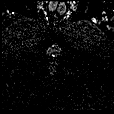

[Series 7: DWI · axial · 3.0mm · 1.75mm/px · 1 of 24 slices shown (3 of 3)]
[im 1/24]
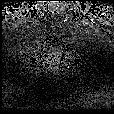

[Series 8: T2 · axial · 3.0mm · 0.56mm/px · 1 of 24 slices shown (2 of 3)]
[im 1/24]
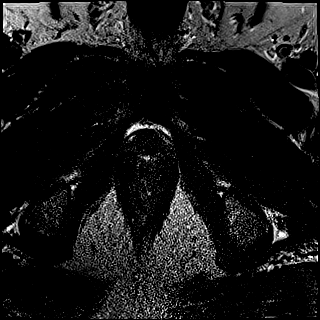

[Series 9: T2 · axial · 1.0mm · 1.04mm/px · 1 of 72 slices shown (3 of 3)]
[im 1/72]
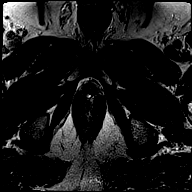

[Series 10: pre t1_twist_tra_dyn · axial · non-contrast · 3.5mm · 0.83mm/px · 1 of 20 slices shown]
[im 1/20]
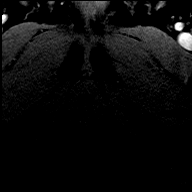

[Series 11: post t1_twist_tra_dyn-copy center · axial · non-contrast · 3.5mm · 0.83mm/px · z∈[-232,-166]mm · 17 of 600 slices shown]
[im 1/600]
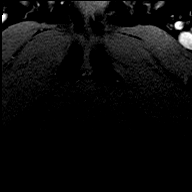
[im 38/600]
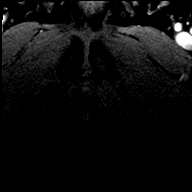
[im 75/600]
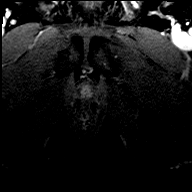
[im 113/600]
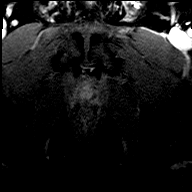
[im 150/600]
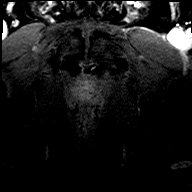
[im 188/600]
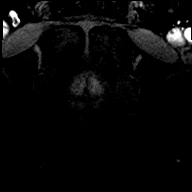
[im 225/600]
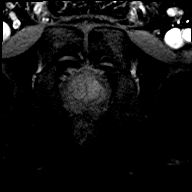
[im 263/600]
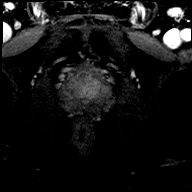
[im 300/600]
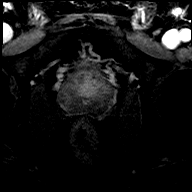
[im 337/600]
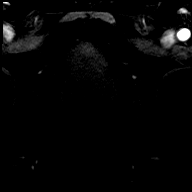
[im 375/600]
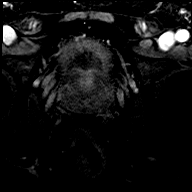
[im 412/600]
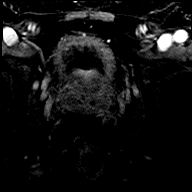
[im 450/600]
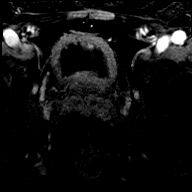
[im 487/600]
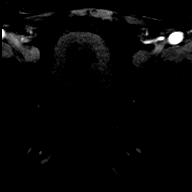
[im 525/600]
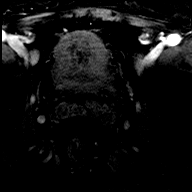
[im 562/600]
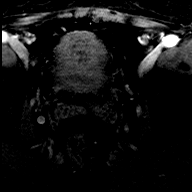
[im 600/600]
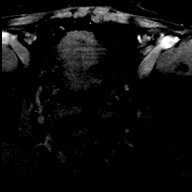

[Series 12: post t1_twist_tra_dyn-copy cent_sub · axial · 3.5mm · 0.83mm/px · z∈[-232,-166]mm · 16 of 580 slices shown]
[im 1/580]
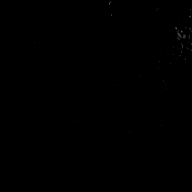
[im 39/580]
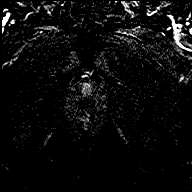
[im 78/580]
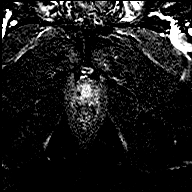
[im 116/580]
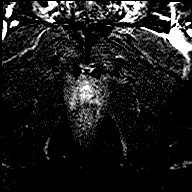
[im 155/580]
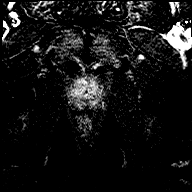
[im 194/580]
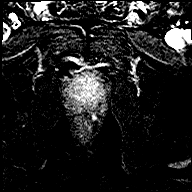
[im 232/580]
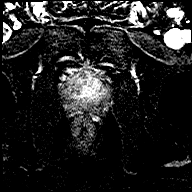
[im 271/580]
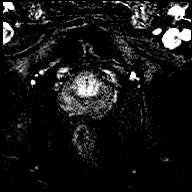
[im 309/580]
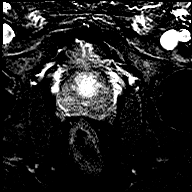
[im 348/580]
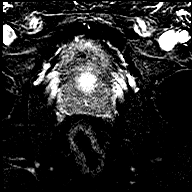
[im 387/580]
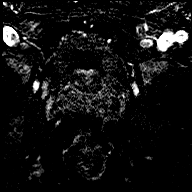
[im 425/580]
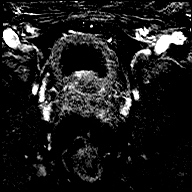
[im 464/580]
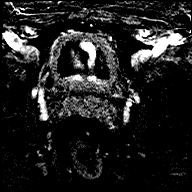
[im 502/580]
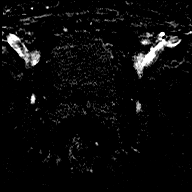
[im 541/580]
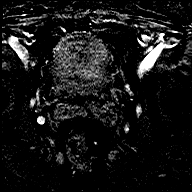
[im 580/580]
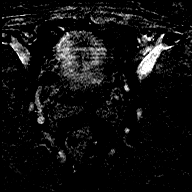

[Series 13: t1_vibe_dixon_tra_f · axial · 2.5mm · 1.14mm/px · z∈[-292,-54]mm · 3 of 91 slices shown]
[im 1/91]
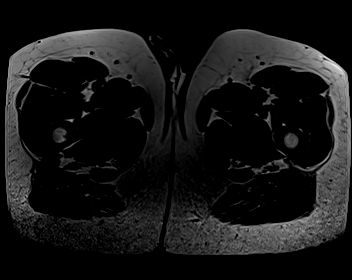
[im 46/91]
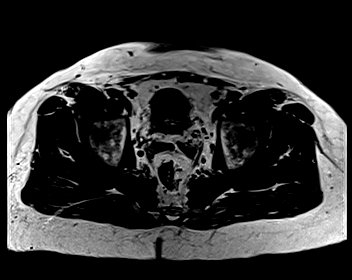
[im 91/91]
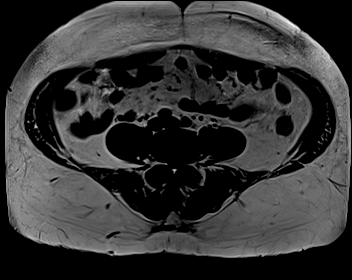

[Series 14: t1_vibe_dixon_tra_w · axial · 2.5mm · 1.14mm/px · z∈[-292,-54]mm · 3 of 96 slices shown]
[im 1/96]
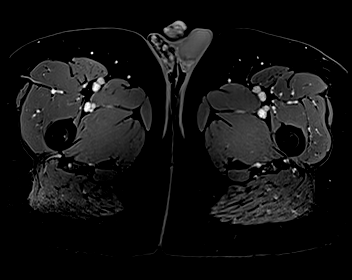
[im 48/96]
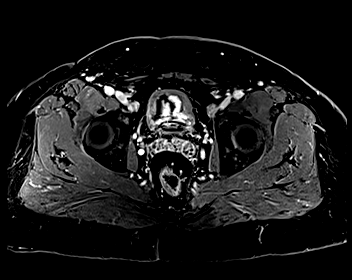
[im 96/96]
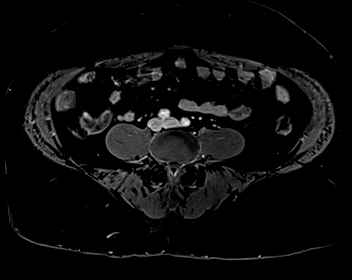

[48 of 48 positions shown; findings below may reference images not displayed]

FINDINGS: Prostate: Mild accentuated T1 signal in the peripheral zone
bilaterally but most notably in the right posterolateral peripheral
zone of the mid gland for example on image 32 of series [DATE].

Region of interest # 1: PI-RADS category 4 lesion of the right
posterolateral peripheral zone in the mid gland with focally reduced
T2 signal and early enhancement. This measures 0.33 cubic cm (0.8 by
0.5 by 1.0 cm) and is shown for example on image 37 series 9.

No significant abnormal restricted diffusion in the prostate gland.
There is some hazy low T2 signal bilaterally in the peripheral zone
which is nonfocal and without focal early enhancement. This would
typically be considered PI-RADS category 2 signal. However, in the
context of the patient's recent bilateral positive biopsy, this hazy
stranding may be at risk of actually representing indistinct
infiltrative tumor. No specific additional focal target for biopsy
identified.

Volume: 3D volumetric analysis: Prostate volume 38.64 cubic cm (4.8
by 4.3 by 4.1 cm).

Transcapsular spread:  Not directly observed.

Seminal vesicle involvement: Absent

Neurovascular bundle involvement: Absent

Pelvic adenopathy: Absent

Bone metastasis: Absent

Other findings: No supplemental non-categorized findings.
IMPRESSION: 1. Small PI-RADS category 4 lesion of the right posterolateral
peripheral zone. Targeting data sent to [facility].
2. Hazy low T2 signal bilaterally in the peripheral zone which is
nonfocal info out early enhancement. This would normally be
considered PI-RADS category 2 postinflammatory signal. However, in
light of the patient's recent bilateral positive biopsy with fairly
widespread involvement, the hazy stranding may be a risk of actually
representing indistinct infiltrative tumor. No specific additional
focal target for biopsy identified given the diffuse appearance of
this process.
3. Prostate volume 38.64 cubic cm.
4. No pelvic adenopathy or direct findings of extraprostatic spread.

## 2021-02-27 MED ORDER — GADOBENATE DIMEGLUMINE 529 MG/ML IV SOLN
20.0000 mL | Freq: Once | INTRAVENOUS | Status: AC | PRN
  Administered 2021-02-27: 20 mL via INTRAVENOUS

## 2021-03-15 ENCOUNTER — Other Ambulatory Visit: Payer: Self-pay | Admitting: Urology

## 2021-03-15 DIAGNOSIS — U071 COVID-19: Secondary | ICD-10-CM

## 2021-03-15 HISTORY — DX: COVID-19: U07.1

## 2021-03-16 LAB — SARS CORONAVIRUS 2 (TAT 6-24 HRS): SARS Coronavirus 2: POSITIVE — AB

## 2021-04-01 NOTE — Progress Notes (Addendum)
COVID swab appointment:    COVID Vaccine Completed:  Yes x3 Date COVID Vaccine completed:  12-14-19 01-04-20 Has received booster:07-31-20 COVID vaccine manufacturer: [employer]       Date of COVID positive in last 90 days:  03-15-21.  Results in [place]   PCP - Cathlean Cower, MD Cardiologist -  N/A   Chest x-ray - N/A EKG - 02-26-21 Epic Stress Test - N/A ECHO - N/A Cardiac Cath - N/A Pacemaker/ICD device last checked: Spinal Cord Stimulator:   Sleep Study - N/A CPAP -    Fasting Blood Sugar - N/A Checks Blood Sugar _____ times a day   Blood Thinner Instructions: N/A Aspirin Instructions: Last Dose:   Activity level:             Can go up a flight of stairs and perform activities of daily living without stopping and without symptoms of chest pain or shortness of breath.  Able to exercise without symptoms       Anesthesia review:  N/A   Patient denies shortness of breath, fever, cough and chest pain at PAT appointment

## 2021-04-01 NOTE — Patient Instructions (Addendum)
DUE TO COVID-19 ONLY ONE VISITOR IS ALLOWED TO COME WITH YOU AND STAY IN THE WAITING ROOM ONLY DURING PRE OP AND PROCEDURE.   **NO VISITORS ARE ALLOWED IN THE SHORT STAY AREA OR RECOVERY ROOM!!**        Your procedure is scheduled on:  Wednesday, 04-21-21   Report to [facility] Main  Entrance    Report to admitting at 6:15 AM   Call this number if you have problems the morning of surgery [phone]   Fleet enema morning of surgery   Do not eat food :After Midnight.   May have liquids until 5:30 AM day of surgery  CLEAR LIQUID DIET  Foods Allowed                                                                     Foods Excluded  Water, Black Coffee (no milk/no creamer) and tea, regular and decaf                              liquids that you cannot  Plain Jell-O in any flavor  (No red)                         see through such as: Fruit ices (not with fruit pulp)                                 milk, soups, orange juice  Iced Popsicles (No red)                                    All solid food                             Apple juices Sports drinks like Gatorade (No red) Lightly seasoned clear broth or consume(fat free) Sugar      Oral Hygiene is also important to reduce your risk of infection.                                    Remember - BRUSH YOUR TEETH THE MORNING OF SURGERY WITH YOUR REGULAR TOOTHPASTE   Do NOT smoke after Midnight   Take these medicines the morning of surgery with A SIP OF WATER:  Atorvastatin   Stop all vitamins and herbal supplements a week before surgery             You may not have any metal on your body including  jewelry, and body piercing             Do not wear  lotions, powders, cologne, or deodorant              Men may shave face and neck.  Do not bring valuables to the hospital. [place].   Contacts, dentures or bridgework may not be worn into surgery.   Bring small overnight bag day  of  surgery.   Please read over the following fact sheets you were given: IF YOU HAVE QUESTIONS ABOUT YOUR PRE OP INSTRUCTIONS PLEASE CALL [place] - Preparing for Surgery Before surgery, you can play an important role.  Because skin is not sterile, your skin needs to be as free of germs as possible.  You can reduce the number of germs on your skin by washing with CHG (chlorahexidine gluconate) soap before surgery.  CHG is an antiseptic cleaner which kills germs and bonds with the skin to continue killing germs even after washing. Please DO NOT use if you have an allergy to CHG or antibacterial soaps.  If your skin becomes reddened/irritated stop using the CHG and inform your nurse when you arrive at Short Stay. Do not shave (including legs and underarms) for at least 48 hours prior to the first CHG shower.  You may shave your face/neck.  Please follow these instructions carefully:  1.  Shower with CHG Soap the night before surgery and the  morning of surgery.  2.  If you choose to wash your hair, wash your hair first as usual with your normal  shampoo.  3.  After you shampoo, rinse your hair and body thoroughly to remove the shampoo.                             4.  Use CHG as you would any other liquid soap.  You can apply chg directly to the skin and wash.  Gently with a scrungie or clean washcloth.  5.  Apply the CHG Soap to your body ONLY FROM THE NECK DOWN.   Do   not use on face/ open                           Wound or open sores. Avoid contact with eyes, ears mouth and   genitals (private parts).                       Wash face,  Genitals (private parts) with your normal soap.             6.  Wash thoroughly, paying special attention to the area where your    surgery  will be performed.  7.  Thoroughly rinse your body with warm water from the neck down.  8.  DO NOT shower/wash with your normal soap after using and rinsing off the CHG Soap.                9.  Pat yourself  dry with a clean towel.            10.  Wear clean pajamas.            11.  Place clean sheets on your bed the night of your first shower and do not  sleep with pets. Day of Surgery : Do not apply any lotions/deodorants the morning of surgery.  Please wear clean clothes to the hospital/surgery center.  FAILURE TO FOLLOW THESE INSTRUCTIONS MAY RESULT IN THE CANCELLATION OF YOUR SURGERY  PATIENT SIGNATURE_________________________________  NURSE SIGNATURE__________________________________  ________________________________________________________________________  WHAT IS A BLOOD TRANSFUSION? Blood Transfusion Information  A transfusion is the replacement of blood or some of its parts. Blood is made up of multiple cells which provide different functions. Red blood cells carry oxygen and are used for  blood loss replacement. White blood cells fight against infection. Platelets control bleeding. Plasma helps clot blood. Other blood products are available for specialized needs, such as hemophilia or other clotting disorders. BEFORE THE TRANSFUSION  Who gives blood for transfusions?  Healthy volunteers who are fully evaluated to make sure their blood is safe. This is blood bank blood. Transfusion therapy is the safest it has ever been in the practice of medicine. Before blood is taken from a donor, a complete history is taken to make sure that person has no history of diseases nor engages in risky social behavior (examples are intravenous drug use or sexual activity with multiple partners). The donor's travel history is screened to minimize risk of transmitting infections, such as malaria. The donated blood is tested for signs of infectious diseases, such as HIV and hepatitis. The blood is then tested to be sure it is compatible with you in order to minimize the chance of a transfusion reaction. If you or a relative donates blood, this is often done in anticipation of surgery and is not appropriate for  emergency situations. It takes many days to process the donated blood. RISKS AND COMPLICATIONS Although transfusion therapy is very safe and saves many lives, the main dangers of transfusion include:  Getting an infectious disease. Developing a transfusion reaction. This is an allergic reaction to something in the blood you were given. Every precaution is taken to prevent this. The decision to have a blood transfusion has been considered carefully by your caregiver before blood is given. Blood is not given unless the benefits outweigh the risks. AFTER THE TRANSFUSION Right after receiving a blood transfusion, you will usually feel much better and more energetic. This is especially true if your red blood cells have gotten low (anemic). The transfusion raises the level of the red blood cells which carry oxygen, and this usually causes an energy increase. The nurse administering the transfusion will monitor you carefully for complications. HOME CARE INSTRUCTIONS  No special instructions are needed after a transfusion. You may find your energy is better. Speak with your caregiver about any limitations on activity for underlying diseases you may have. SEEK MEDICAL CARE IF:  Your condition is not improving after your transfusion. You develop redness or irritation at the intravenous (IV) site. SEEK IMMEDIATE MEDICAL CARE IF:  Any of the following symptoms occur over the next 12 hours: Shaking chills. You have a temperature by mouth above 102 F (38.9 C), not controlled by medicine. Chest, back, or muscle pain. People around you feel you are not acting correctly or are confused. Shortness of breath or difficulty breathing. Dizziness and fainting. You get a rash or develop hives. You have a decrease in urine output. Your urine turns a dark color or changes to pink, red, or brown. Any of the following symptoms occur over the next 10 days: You have a temperature by mouth above 102 F (38.9 C), not  controlled by medicine. Shortness of breath. Weakness after normal activity. The white part of the eye turns yellow (jaundice). You have a decrease in the amount of urine or are urinating less often. Your urine turns a dark color or changes to pink, red, or brown. Document Released: 07/01/2000 Document Revised: 09/26/2011 Document Reviewed: 02/18/2008 [facility] Patient Information 2014 [place], [place].  _______________________________________________________________________

## 2021-04-02 ENCOUNTER — Encounter (HOSPITAL_COMMUNITY): Payer: Self-pay

## 2021-04-02 ENCOUNTER — Other Ambulatory Visit: Payer: Self-pay

## 2021-04-02 ENCOUNTER — Encounter (HOSPITAL_COMMUNITY)
Admission: RE | Admit: 2021-04-02 | Discharge: 2021-04-02 | Disposition: A | Discharge status: Home / Self Care | Payer: 59 | Source: Ambulatory Visit | Attending: Urology | Admitting: Urology

## 2021-04-02 DIAGNOSIS — Z01812 Encounter for preprocedural laboratory examination: Secondary | ICD-10-CM | POA: Insufficient documentation

## 2021-04-02 LAB — BASIC METABOLIC PANEL
Anion gap: 8 (ref 5–15)
BUN: 19 mg/dL (ref 6–20)
CO2: 26 mmol/L (ref 22–32)
Calcium: 9.3 mg/dL (ref 8.9–10.3)
Chloride: 104 mmol/L (ref 98–111)
Creatinine, Ser: 0.9 mg/dL (ref 0.61–1.24)
GFR, Estimated: >60 mL/min (ref >60)
Glucose, Bld: 87 mg/dL (ref 70–99)
Potassium: 3.5 mmol/L (ref 3.5–5.1)
Sodium: 138 mmol/L (ref 135–145)

## 2021-04-02 LAB — CBC
HCT: 43.7 % (ref 39.0–52.0)
Hemoglobin: 14.2 g/dL (ref 13.0–17.0)
MCH: 28 pg (ref 26.0–34.0)
MCHC: 32.5 g/dL (ref 30.0–36.0)
MCV: 86.2 fL (ref 80.0–100.0)
Platelets: 315 10*3/uL (ref 150–400)
RBC: 5.07 MIL/uL (ref 4.22–5.81)
RDW: 13.9 % (ref 11.5–15.5)
WBC: 7.7 10*3/uL (ref 4.0–10.5)
nRBC: 0 % (ref 0.0–0.2)

## 2021-04-20 NOTE — Anesthesia Preprocedure Evaluation (Addendum)
Anesthesia Evaluation  Patient identified by MRN, date of birth, ID band Patient awake    Reviewed: Allergy & Precautions, NPO status , Patient's Chart, lab work & pertinent test results  Airway Mallampati: II  TM Distance: >3 FB Neck ROM: Full    Dental no notable dental hx. (+) Dental Advisory Given, Teeth Intact   Pulmonary neg pulmonary ROS,    Pulmonary exam normal breath sounds clear to auscultation       Cardiovascular hypertension, Pt. on medications Normal cardiovascular exam Rhythm:Regular Rate:Normal     Neuro/Psych negative neurological ROS     GI/Hepatic negative GI ROS, Neg liver ROS, GERD  ,  Endo/Other  negative endocrine ROS  Renal/GU negative Renal ROS     Musculoskeletal negative musculoskeletal ROS (+)   Abdominal (+) + obese,   Peds  Hematology negative hematology ROS (+)   Anesthesia Other Findings   Reproductive/Obstetrics                           Anesthesia Physical Anesthesia Plan  ASA: 2  Anesthesia Plan: General   Post-op Pain Management:    Induction: Intravenous  PONV Risk Score and Plan: 3 and Ondansetron, Dexamethasone, Treatment may vary due to age or medical condition, Midazolam and Diphenhydramine  Airway Management Planned: Oral ETT and Video Laryngoscope Planned  Additional Equipment: None  Intra-op Plan:   Post-operative Plan: Extubation in OR  Informed Consent: I have reviewed the patients History and Physical, chart, labs and discussed the procedure including the risks, benefits and alternatives for the proposed anesthesia with the patient or authorized representative who has indicated his/her understanding and acceptance.     Dental advisory given  Plan Discussed with: CRNA  Anesthesia Plan Comments:       Anesthesia Quick Evaluation

## 2021-04-21 ENCOUNTER — Ambulatory Visit (HOSPITAL_COMMUNITY): Payer: 59 | Admitting: Anesthesiology

## 2021-04-21 ENCOUNTER — Encounter (HOSPITAL_COMMUNITY)
Admission: RE | Disposition: A | Discharge status: Home / Self Care | Payer: Self-pay | Source: Home / Self Care | Attending: Urology

## 2021-04-21 ENCOUNTER — Other Ambulatory Visit: Payer: Self-pay

## 2021-04-21 ENCOUNTER — Encounter (HOSPITAL_COMMUNITY): Payer: Self-pay | Admitting: Urology

## 2021-04-21 ENCOUNTER — Observation Stay (HOSPITAL_COMMUNITY)
Admission: RE | Admit: 2021-04-21 | Discharge: 2021-04-22 | Disposition: A | Discharge status: Home / Self Care | Payer: 59 | Attending: Urology | Admitting: Urology

## 2021-04-21 ENCOUNTER — Ambulatory Visit (HOSPITAL_COMMUNITY): Payer: 59 | Admitting: Physician Assistant

## 2021-04-21 DIAGNOSIS — Z79899 Other long term (current) drug therapy: Secondary | ICD-10-CM | POA: Diagnosis not present

## 2021-04-21 DIAGNOSIS — I10 Essential (primary) hypertension: Secondary | ICD-10-CM | POA: Diagnosis not present

## 2021-04-21 DIAGNOSIS — C61 Malignant neoplasm of prostate: Principal | ICD-10-CM | POA: Insufficient documentation

## 2021-04-21 HISTORY — PX: PELVIC LYMPH NODE DISSECTION: SHX6543

## 2021-04-21 HISTORY — PX: ROBOT ASSISTED LAPAROSCOPIC RADICAL PROSTATECTOMY: SHX5141

## 2021-04-21 LAB — HEMOGLOBIN AND HEMATOCRIT, BLOOD
HCT: 42.5 % (ref 39.0–52.0)
Hemoglobin: 14 g/dL (ref 13.0–17.0)

## 2021-04-21 LAB — TYPE AND SCREEN
ABO/RH(D): O POS
Antibody Screen: NEGATIVE

## 2021-04-21 LAB — ABO/RH: ABO/RH(D): O POS

## 2021-04-21 SURGERY — PROSTATECTOMY, RADICAL, ROBOT-ASSISTED, LAPAROSCOPIC
Anesthesia: General

## 2021-04-21 MED ORDER — DEXTROSE-NACL 5-0.45 % IV SOLN: INTRAVENOUS | Status: AC

## 2021-04-21 MED ORDER — STERILE WATER FOR INJECTION IJ SOLN
INTRAMUSCULAR | Status: AC
  Filled 2021-04-21: qty 10

## 2021-04-21 MED ORDER — BUPIVACAINE LIPOSOME 1.3 % IJ SUSP
INTRAMUSCULAR | Status: DC | PRN
  Administered 2021-04-21: 20 mL

## 2021-04-21 MED ORDER — SODIUM CHLORIDE (PF) 0.9 % IJ SOLN
INTRAMUSCULAR | Status: AC
  Filled 2021-04-21: qty 10

## 2021-04-21 MED ORDER — CEFAZOLIN SODIUM-DEXTROSE 2-4 GM/100ML-% IV SOLN
2.0000 g | INTRAVENOUS | Status: AC
  Administered 2021-04-21: 2 g via INTRAVENOUS
  Filled 2021-04-21: qty 100

## 2021-04-21 MED ORDER — ROCURONIUM BROMIDE 10 MG/ML (PF) SYRINGE
PREFILLED_SYRINGE | INTRAVENOUS | Status: AC
  Filled 2021-04-21: qty 10

## 2021-04-21 MED ORDER — ALBUMIN HUMAN 5 % IV SOLN: INTRAVENOUS | Status: DC | PRN

## 2021-04-21 MED ORDER — MIDAZOLAM HCL 2 MG/2ML IJ SOLN
INTRAMUSCULAR | Status: DC | PRN
  Administered 2021-04-21: 2 mg via INTRAVENOUS

## 2021-04-21 MED ORDER — LACTATED RINGERS IV SOLN: INTRAVENOUS | Status: DC | PRN

## 2021-04-21 MED ORDER — HYDROMORPHONE HCL 1 MG/ML IJ SOLN
INTRAMUSCULAR | Status: DC | PRN
  Administered 2021-04-21 (×4): .5 mg via INTRAVENOUS

## 2021-04-21 MED ORDER — MEPERIDINE HCL 50 MG/ML IJ SOLN: 6.2500 mg | INTRAMUSCULAR | Status: DC | PRN

## 2021-04-21 MED ORDER — HYDROMORPHONE HCL 1 MG/ML IJ SOLN
0.5000 mg | INTRAMUSCULAR | Status: DC | PRN
  Administered 2021-04-21 (×2): 0.5 mg via INTRAVENOUS
  Filled 2021-04-21 (×2): qty 0.5

## 2021-04-21 MED ORDER — SENNOSIDES-DOCUSATE SODIUM 8.6-50 MG PO TABS
2.0000 | ORAL_TABLET | Freq: Every day | ORAL | Status: DC
  Administered 2021-04-21: 2 via ORAL
  Filled 2021-04-21: qty 2

## 2021-04-21 MED ORDER — ORAL CARE MOUTH RINSE: 15.0000 mL | Freq: Once | OROMUCOSAL | Status: AC

## 2021-04-21 MED ORDER — PROPOFOL 10 MG/ML IV BOLUS
INTRAVENOUS | Status: AC
  Filled 2021-04-21: qty 20

## 2021-04-21 MED ORDER — DIPHENHYDRAMINE HCL 50 MG/ML IJ SOLN
INTRAMUSCULAR | Status: DC | PRN
  Administered 2021-04-21: 12.5 mg via INTRAVENOUS

## 2021-04-21 MED ORDER — LACTATED RINGERS IV SOLN: INTRAVENOUS | Status: DC

## 2021-04-21 MED ORDER — PROPOFOL 10 MG/ML IV BOLUS
INTRAVENOUS | Status: DC | PRN
  Administered 2021-04-21: 200 mg via INTRAVENOUS

## 2021-04-21 MED ORDER — TRAMADOL HCL 50 MG PO TABS
50.0000 mg | ORAL_TABLET | Freq: Four times a day (QID) | ORAL | Status: DC | PRN
  Administered 2021-04-22: 100 mg via ORAL
  Filled 2021-04-21: qty 2

## 2021-04-21 MED ORDER — LACTATED RINGERS IR SOLN
Status: DC | PRN
  Administered 2021-04-21: 1000 mL

## 2021-04-21 MED ORDER — SUFENTANIL CITRATE 50 MCG/ML IV SOLN
INTRAVENOUS | Status: DC | PRN
  Administered 2021-04-21 (×3): 10 ug via INTRAVENOUS
  Administered 2021-04-21: 20 ug via INTRAVENOUS
  Administered 2021-04-21 (×5): 10 ug via INTRAVENOUS

## 2021-04-21 MED ORDER — DEXAMETHASONE SODIUM PHOSPHATE 10 MG/ML IJ SOLN
INTRAMUSCULAR | Status: AC
  Filled 2021-04-21: qty 1

## 2021-04-21 MED ORDER — ATORVASTATIN CALCIUM 20 MG PO TABS
20.0000 mg | ORAL_TABLET | Freq: Every day | ORAL | Status: DC
  Administered 2021-04-22: 20 mg via ORAL
  Filled 2021-04-21: qty 1

## 2021-04-21 MED ORDER — ALBUMIN HUMAN 5 % IV SOLN
INTRAVENOUS | Status: AC
  Filled 2021-04-21: qty 250

## 2021-04-21 MED ORDER — CEFAZOLIN SODIUM-DEXTROSE 1-4 GM/50ML-% IV SOLN
1.0000 g | Freq: Three times a day (TID) | INTRAVENOUS | Status: AC
  Administered 2021-04-21 – 2021-04-22 (×2): 1 g via INTRAVENOUS
  Filled 2021-04-21 (×2): qty 50

## 2021-04-21 MED ORDER — MIDAZOLAM HCL 2 MG/2ML IJ SOLN
INTRAMUSCULAR | Status: AC
  Filled 2021-04-21: qty 2

## 2021-04-21 MED ORDER — CHLORHEXIDINE GLUCONATE CLOTH 2 % EX PADS
6.0000 | MEDICATED_PAD | Freq: Every day | CUTANEOUS | Status: DC
  Administered 2021-04-21 – 2021-04-22 (×2): 6 via TOPICAL

## 2021-04-21 MED ORDER — ROCURONIUM BROMIDE 10 MG/ML (PF) SYRINGE
PREFILLED_SYRINGE | INTRAVENOUS | Status: DC | PRN
  Administered 2021-04-21: 10 mg via INTRAVENOUS
  Administered 2021-04-21 (×4): 20 mg via INTRAVENOUS
  Administered 2021-04-21: 60 mg via INTRAVENOUS
  Administered 2021-04-21: 20 mg via INTRAVENOUS

## 2021-04-21 MED ORDER — ACETAMINOPHEN 10 MG/ML IV SOLN
1000.0000 mg | Freq: Four times a day (QID) | INTRAVENOUS | Status: DC
  Administered 2021-04-21 – 2021-04-22 (×3): 1000 mg via INTRAVENOUS
  Filled 2021-04-21 (×3): qty 100

## 2021-04-21 MED ORDER — LIDOCAINE HCL (PF) 2 % IJ SOLN
INTRAMUSCULAR | Status: AC
  Filled 2021-04-21: qty 5

## 2021-04-21 MED ORDER — ONDANSETRON HCL 4 MG/2ML IJ SOLN: 4.0000 mg | INTRAMUSCULAR | Status: DC | PRN

## 2021-04-21 MED ORDER — HYDROMORPHONE HCL 1 MG/ML IJ SOLN
INTRAMUSCULAR | Status: AC
  Administered 2021-04-21: 0.25 mg via INTRAVENOUS
  Filled 2021-04-21: qty 1

## 2021-04-21 MED ORDER — HYDROMORPHONE HCL 2 MG/ML IJ SOLN
INTRAMUSCULAR | Status: AC
  Filled 2021-04-21: qty 1

## 2021-04-21 MED ORDER — SODIUM CHLORIDE 0.9 % IV BOLUS
1000.0000 mL | Freq: Once | INTRAVENOUS | Status: AC
  Administered 2021-04-21: 1000 mL via INTRAVENOUS

## 2021-04-21 MED ORDER — FLEET ENEMA 7-19 GM/118ML RE ENEM
1.0000 | ENEMA | Freq: Once | RECTAL | Status: DC
  Filled 2021-04-21: qty 1

## 2021-04-21 MED ORDER — SUGAMMADEX SODIUM 500 MG/5ML IV SOLN
INTRAVENOUS | Status: DC | PRN
  Administered 2021-04-21: 250 mg via INTRAVENOUS

## 2021-04-21 MED ORDER — ONDANSETRON HCL 4 MG/2ML IJ SOLN
INTRAMUSCULAR | Status: DC | PRN
  Administered 2021-04-21: 4 mg via INTRAVENOUS

## 2021-04-21 MED ORDER — CHLORHEXIDINE GLUCONATE 0.12 % MT SOLN
15.0000 mL | Freq: Once | OROMUCOSAL | Status: AC
  Administered 2021-04-21: 15 mL via OROMUCOSAL

## 2021-04-21 MED ORDER — SUGAMMADEX SODIUM 500 MG/5ML IV SOLN
INTRAVENOUS | Status: AC
  Filled 2021-04-21: qty 5

## 2021-04-21 MED ORDER — PROMETHAZINE HCL 25 MG/ML IJ SOLN: 6.2500 mg | INTRAMUSCULAR | Status: DC | PRN

## 2021-04-21 MED ORDER — DIPHENHYDRAMINE HCL 50 MG/ML IJ SOLN
INTRAMUSCULAR | Status: AC
  Filled 2021-04-21: qty 1

## 2021-04-21 MED ORDER — LIDOCAINE 2% (20 MG/ML) 5 ML SYRINGE
INTRAMUSCULAR | Status: DC | PRN
  Administered 2021-04-21: 100 mg via INTRAVENOUS

## 2021-04-21 MED ORDER — SUFENTANIL CITRATE 50 MCG/ML IV SOLN
INTRAVENOUS | Status: AC
  Filled 2021-04-21: qty 1

## 2021-04-21 MED ORDER — HYDROMORPHONE HCL 1 MG/ML IJ SOLN: 0.2500 mg | INTRAMUSCULAR | Status: DC | PRN

## 2021-04-21 MED ORDER — BELLADONNA ALKALOIDS-OPIUM 16.2-60 MG RE SUPP
RECTAL | Status: AC
  Administered 2021-04-21: 1
  Filled 2021-04-21: qty 1

## 2021-04-21 MED ORDER — BUPIVACAINE-EPINEPHRINE 0.5% -1:200000 IJ SOLN
INTRAMUSCULAR | Status: DC | PRN
  Administered 2021-04-21: 9 mL

## 2021-04-21 MED ORDER — ONDANSETRON HCL 4 MG/2ML IJ SOLN
INTRAMUSCULAR | Status: AC
  Filled 2021-04-21: qty 2

## 2021-04-21 MED ORDER — DEXAMETHASONE SODIUM PHOSPHATE 10 MG/ML IJ SOLN
INTRAMUSCULAR | Status: DC | PRN
  Administered 2021-04-21: 10 mg via INTRAVENOUS

## 2021-04-21 SURGICAL SUPPLY — 62 items
ADH SKN CLS APL DERMABOND .7 (GAUZE/BANDAGES/DRESSINGS) ×2
APL PRP STRL LF DISP 70% ISPRP (MISCELLANEOUS) ×2
BAG COUNTER SPONGE SURGICOUNT (BAG) IMPLANT
BAG SPNG CNTER NS LX DISP (BAG)
CATH FOLEY 2WAY SLVR 18FR 30CC (CATHETERS) ×3 IMPLANT
CATH TIEMANN FOLEY 18FR 5CC (CATHETERS) ×3 IMPLANT
CHLORAPREP W/TINT 26 (MISCELLANEOUS) ×3 IMPLANT
CLIP LIGATING HEM O LOK PURPLE (MISCELLANEOUS) ×6 IMPLANT
COVER SURGICAL LIGHT HANDLE (MISCELLANEOUS) ×3 IMPLANT
COVER TIP SHEARS 8 DVNC (MISCELLANEOUS) ×2 IMPLANT
COVER TIP SHEARS 8MM DA VINCI (MISCELLANEOUS) ×3
CUTTER ECHEON FLEX ENDO 45 340 (ENDOMECHANICALS) ×3 IMPLANT
DECANTER SPIKE VIAL GLASS SM (MISCELLANEOUS) ×3 IMPLANT
DERMABOND ADVANCED (GAUZE/BANDAGES/DRESSINGS) ×1
DERMABOND ADVANCED .7 DNX12 (GAUZE/BANDAGES/DRESSINGS) ×2 IMPLANT
DRAIN CHANNEL RND F F (WOUND CARE) IMPLANT
DRAPE ARM DVNC X/XI (DISPOSABLE) ×8 IMPLANT
DRAPE COLUMN DVNC XI (DISPOSABLE) ×2 IMPLANT
DRAPE DA VINCI XI ARM (DISPOSABLE) ×12
DRAPE DA VINCI XI COLUMN (DISPOSABLE) ×3
DRAPE SURG IRRIG POUCH 19X23 (DRAPES) ×3 IMPLANT
DRSG TEGADERM 4X4.75 (GAUZE/BANDAGES/DRESSINGS) ×3 IMPLANT
ELECT PENCIL ROCKER SW 15FT (MISCELLANEOUS) ×3 IMPLANT
ELECT REM PT RETURN 15FT ADLT (MISCELLANEOUS) ×3 IMPLANT
GAUZE 4X4 16PLY ~~LOC~~+RFID DBL (SPONGE) ×1 IMPLANT
GAUZE SPONGE 2X2 8PLY STRL LF (GAUZE/BANDAGES/DRESSINGS) IMPLANT
GAUZE SPONGE 4X4 12PLY STRL (GAUZE/BANDAGES/DRESSINGS) ×1 IMPLANT
GLOVE SURG ENC MOIS LTX SZ6.5 (GLOVE) ×3 IMPLANT
GLOVE SURG ENC TEXT LTX SZ7.5 (GLOVE) ×6 IMPLANT
GOWN STRL REUS W/TWL LRG LVL3 (GOWN DISPOSABLE) ×3 IMPLANT
GOWN STRL REUS W/TWL XL LVL3 (GOWN DISPOSABLE) ×7 IMPLANT
HOLDER FOLEY CATH W/STRAP (MISCELLANEOUS) ×3 IMPLANT
IRRIG SUCT STRYKERFLOW 2 WTIP (MISCELLANEOUS) ×3
IRRIGATION SUCT STRKRFLW 2 WTP (MISCELLANEOUS) ×2 IMPLANT
KIT TURNOVER KIT A (KITS) ×3 IMPLANT
PACK ROBOT UROLOGY CUSTOM (CUSTOM PROCEDURE TRAY) ×3 IMPLANT
PAD POSITIONING PINK XL (MISCELLANEOUS) ×3 IMPLANT
PENCIL SMOKE EVACUATOR (MISCELLANEOUS) IMPLANT
RELOAD STAPLE 45 4.1 GRN THCK (STAPLE) ×2 IMPLANT
SEAL CANN UNIV 5-8 DVNC XI (MISCELLANEOUS) ×8 IMPLANT
SEAL XI 5MM-8MM UNIVERSAL (MISCELLANEOUS) ×12
SET TUBE SMOKE EVAC HIGH FLOW (TUBING) ×3 IMPLANT
SOL PREP PROV IODINE SCRUB 4OZ (MISCELLANEOUS) ×1 IMPLANT
SOLUTION ELECTROLUBE (MISCELLANEOUS) ×3 IMPLANT
SPONGE GAUZE 2X2 STER 10/PKG (GAUZE/BANDAGES/DRESSINGS)
SPONGE T-LAP 4X18 ~~LOC~~+RFID (SPONGE) ×1 IMPLANT
STAPLE RELOAD 45 GRN (STAPLE) ×2 IMPLANT
STAPLE RELOAD 45MM GREEN (STAPLE) ×3
SUT ETHILON 3 0 PS 1 (SUTURE) ×3 IMPLANT
SUT MNCRL AB 4-0 PS2 18 (SUTURE) ×6 IMPLANT
SUT V-LOC BARB 180 2/0GR6 GS22 (SUTURE) ×3
SUT VIC AB 0 CT1 27 (SUTURE) ×3
SUT VIC AB 0 CT1 27XBRD ANTBC (SUTURE) ×2 IMPLANT
SUT VIC AB 2-0 SH 27 (SUTURE) ×3
SUT VIC AB 2-0 SH 27X BRD (SUTURE) ×1 IMPLANT
SUT VICRYL 0 UR6 27IN ABS (SUTURE) ×6 IMPLANT
SUT VLOC BARB 180 ABS3/0GR12 (SUTURE) ×6
SUTURE V-LC BRB 180 2/0GR6GS22 (SUTURE) ×2 IMPLANT
SUTURE VLOC BRB 180 ABS3/0GR12 (SUTURE) ×4 IMPLANT
TOWEL OR NON WOVEN STRL DISP B (DISPOSABLE) ×2 IMPLANT
TROCAR XCEL NON-BLD 5MMX100MML (ENDOMECHANICALS) IMPLANT
WATER STERILE IRR 1000ML POUR (IV SOLUTION) ×3 IMPLANT

## 2021-04-21 NOTE — Transfer of Care (Signed)
Immediate Anesthesia Transfer of Care Note  Patient: Jesus Hamilton  Procedure(s) Performed: XI ROBOTIC ASSISTED LAPAROSCOPIC RADICAL PROSTATECTOMY BILATERAL PELVIC LYMPH NODE DISSECTION (Bilateral)  Patient Location: PACU  Anesthesia Type:General  Level of Consciousness: drowsy  Airway & Oxygen Therapy: Patient Spontanous Breathing and Patient connected to face mask oxygen  Post-op Assessment: Report given to RN and Post -op Vital signs reviewed and stable  Post vital signs: Reviewed and stable  Last Vitals:  Vitals Value Taken Time  BP 158/93 04/21/21 1404  Temp    Pulse 109 04/21/21 1407  Resp 20 04/21/21 1407  SpO2 99 % 04/21/21 1407  Vitals shown include unvalidated device data.  Last Pain:  Vitals:   04/21/21 0703  TempSrc: Oral  PainSc:          Complications: No notable events documented.

## 2021-04-21 NOTE — Discharge Instructions (Addendum)

## 2021-04-21 NOTE — Anesthesia Postprocedure Evaluation (Signed)
Anesthesia Post Note  Patient: Jesus Hamilton  Procedure(s) Performed: XI ROBOTIC ASSISTED LAPAROSCOPIC RADICAL PROSTATECTOMY BILATERAL PELVIC LYMPH NODE DISSECTION (Bilateral)     Patient location during evaluation: PACU Anesthesia Type: General Level of consciousness: sedated and patient cooperative Pain management: pain level controlled Vital Signs Assessment: post-procedure vital signs reviewed and stable Respiratory status: spontaneous breathing Cardiovascular status: stable Anesthetic complications: no   No notable events documented.  Last Vitals:  Vitals:   04/21/21 1515 04/21/21 1543  BP: (!) 141/93 (!) 153/89  Pulse: (!) 107 92  Resp: (!) 22 18  Temp:    SpO2: 90% 97%    Last Pain:  Vitals:   04/21/21 1515  TempSrc:   PainSc: 0-No pain                 Nolon Nations

## 2021-04-21 NOTE — H&P (Signed)
Pt presents today for pre-operative history and physical exam in anticipation of robotic assisted lap radical prostatectomy with bilateral pelvic lymph node dissection by Dr. Louis Meckel on 03/17/21. He is doing well and is without complaint.   Pt denies F/C, HA, CP, SOB, N/V, diarrhea/constipation, back pain, flank pain, hematuria, and dysuria.    HX:   cc: prostate biopsy results   12/24/20: 50 year old man with a family history of prostate cancer underwent prostate biopsy with pathology listed below. He has mild lower urinary tract symptoms and no ED. He has never had abdominal surgery before. He works as a Development worker, international aid for [employer]. He is here alone but his mom knows he's coming.   Pre-biopsy PSA: 20.4  cT1c  Gleason 4+3=7  10/12 cores positive   MSK Nomogram  CSS 15 yr: 91%  PFS: 31%, 19%   Interval: Today the patient follows up with me for discussion of prostate surgery and robotic assisted laparoscopic radical prostatectomy. Denies any progression of his lower urinary tract symptoms. His erectile functions adequate without any medication. He is divorced with 1 small child. He has a girlfriend. He does live with his parents.   Patient has no significant past medical history. He had right hand reconstruction. He has not had any other surgeries.     ALLERGIES: No Known Drug Allergies    MEDICATIONS: Aspirin 81 mg  Lisinopril  Atorvastatin Calcium 20 mg tablet     GU PSH: Locm 300-399Mg /Ml Iodine,1Ml - 12/28/2020 Prostate Needle Biopsy - 12/18/2020     NON-GU PSH: Hand/finger Surgery, Right, 1997 Surgical Pathology, Gross And Microscopic Examination For Prostate Needle - 12/18/2020     GU PMH: Prostate Cancer - 02/01/2021, - 12/28/2020, - 12/24/2020 Family Hx of Prostate Cancer - 12/24/2020, - 11/30/2020 Nocturia - 12/24/2020, - 11/30/2020 Elevated PSA - 12/18/2020, - 11/30/2020      PMH Notes: heart murmur was diagnosed as a child   NON-GU PMH: Arthritis Cardiac murmur,  unspecified Heartburn Hypercholesterolemia Hypertension    FAMILY HISTORY: prostate cancer in father - Father   SOCIAL HISTORY: Marital Status: Single Preferred Language: English; Race: Black or African American Does not use smokeless tobacco. Does drink.  Does not use drugs. Drinks 2 caffeinated drinks per day. Has not had a blood transfusion.     Notes: ETOH weekends only couple beers or couple liquor drinks    REVIEW OF SYSTEMS:    GU Review Male:   Patient denies frequent urination, hard to postpone urination, burning/ pain with urination, get up at night to urinate, leakage of urine, stream starts and stops, trouble starting your stream, have to strain to urinate , erection problems, and penile pain.  Gastrointestinal (Upper):   Patient denies nausea, vomiting, and indigestion/ heartburn.  Gastrointestinal (Lower):   Patient denies diarrhea and constipation.  Constitutional:   Patient denies fever, night sweats, weight loss, and fatigue.  Skin:   Patient denies itching and skin rash/ lesion.  Eyes:   Patient denies blurred vision and double vision.  Ears/ Nose/ Throat:   Patient denies sore throat and sinus problems.  Hematologic/Lymphatic:   Patient denies swollen glands and easy bruising.  Cardiovascular:   Patient denies leg swelling and chest pains.  Respiratory:   Patient denies cough and shortness of breath.  Endocrine:   Patient denies excessive thirst.  Musculoskeletal:   Patient denies back pain and joint pain.  Neurological:   Patient denies headaches and dizziness.  Psychologic:   Patient denies depression and anxiety.  VITAL SIGNS:      02/16/2021 02:24 PM  Weight 250 lb / 113.4 kg  BP 121/79 mmHg  Pulse 76 /min  Temperature 97.7 F / 36.5 C   MULTI-SYSTEM PHYSICAL EXAMINATION:    Constitutional: Well-nourished. No physical deformities. Normally developed. Good grooming.  Neck: Neck symmetrical, not swollen. Normal tracheal position.  Respiratory: Normal  breath sounds. No labored breathing, no use of accessory muscles.   Cardiovascular: Regular rate and rhythm. No murmur, no gallop.   Lymphatic: No enlargement of neck, axillae, groin.  Skin: No paleness, no jaundice, no cyanosis. No lesion, no ulcer, no rash.  Neurologic / Psychiatric: Oriented to time, oriented to place, oriented to person. No depression, no anxiety, no agitation.  Gastrointestinal: No mass, no tenderness, no rigidity, non obese abdomen.  Eyes: Normal conjunctivae. Normal eyelids.  Ears, Nose, Mouth, and Throat: Left ear no scars, no lesions, no masses. Right ear no scars, no lesions, no masses. Nose no scars, no lesions, no masses. Normal hearing. Normal lips.  Musculoskeletal: Normal gait and station of head and neck.     Complexity of Data:  Records Review:   Previous Patient Records  Urine Test Review:   Urinalysis   02/16/21  Urinalysis  Urine Appearance Clear   Urine Color Yellow   Urine Glucose Neg mg/dL  Urine Bilirubin Neg mg/dL  Urine Ketones Neg mg/dL  Urine Specific Gravity 1.025   Urine Blood Neg ery/uL  Urine pH <=5.0   Urine Protein Neg mg/dL  Urine Urobilinogen 0.2 mg/dL  Urine Nitrites Neg   Urine Leukocyte Esterase Neg leu/uL   PROCEDURES:          Urinalysis - 81003 Dipstick Dipstick Cont'd  Color: Yellow Bilirubin: Neg mg/dL  Appearance: Clear Ketones: Neg mg/dL  Specific Gravity: 1.025 Blood: Neg ery/uL  pH: <=5.0 Protein: Neg mg/dL  Glucose: Neg mg/dL Urobilinogen: 0.2 mg/dL    Nitrites: Neg    Leukocyte Esterase: Neg leu/uL    ASSESSMENT:      ICD-10 Details  1 GU:   Prostate Cancer - C61    PLAN:           Schedule Return Visit/Planned Activity: Keep Scheduled Appointment - Schedule Surgery          Document Letter(s):  Created for Patient: Clinical Summary         Notes:   There are no changes in the patients history or physical exam since last evaluation by Dr. Louis Meckel. Pt is scheduled to undergo RALP with BPLND on  03/17/21.   All pt's questions were answered to the best of my ability.

## 2021-04-21 NOTE — Op Note (Signed)
Preoperative diagnosis:  Prostate Cancer   Postoperative diagnosis:  same   Procedure: Robotic assisted laparoscopic radical prostatectomy Bilateral pelvic lymph node dissection  Surgeon: Ardis Hughs, MD First Assistant: Will [place], MD  Anesthesia: General  Complications: None  Intraoperative findings:  #1 -  unable to proceed with the posterior dissection initially, because the patient had a very narrow pelvis I was unable to identify easily the vas deferens posteriorly.  As such I opted to forego this dissection. #2: The bladder neck was wide, and was reconstructed in a modified tennis racquet stitch at the 12 o'clock position. #3: The urethrovesical anastomosis was excellent and watertight. #4: This was a bilateral nerve sparing surgery.  EBL: Minimal  Specimens:  #1.  Prostate and seminal vesicals #2.  Bilateral pelvic lymph nodes  Indication: Jesus Hamilton is a 50 y.o. patient with prostate cancer.  After reviewing the management options for treatment, he elected to proceed with the removal of his prostate. We have discussed the potential benefits and risks of the procedure, side effects of the proposed treatment, the likelihood of the patient achieving the goals of the procedure, and any potential problems that might occur during the procedure or recuperation. Informed consent has been obtained.  Description of procedure:  The patient was consented in the preoperative holding area. He was in brought back to the operating room placed the table in supine position. General anesthesia was then induced and endotracheal tube was inserted. He was then placed in dorsolithotomy position and placed in steep Trendelenburg. He was then prepped and draped in the routine sterile fashion. We, the first assistant and I, then began by making a 10 mm incision supraumbilical midline incision the skin down through into the peritoneum. Then placed a 8 mm trocar. I then inflated the abdomen  and inserted the 0 robotic lens. We then placed 2 additional a 8 millimeter trochars in the patient's left lower abdomen proximally 9 cm apart and 2 trochars on the patient's right lower abdomen, one was an 8 mm trocar and the one most lateral was a 12 mm trocar which was used as the assistant port. A 5 mm trocar was placed by triangulating the 2 right lateral ports as a second assistant port. These ports were all placed under visual guidance. Once the ports were noted to be satisfactory position the robot was docked. We started with the 0 lens, monopolar scissors in the right hand and the [place] forceps the left hand as well as a fenestrated grasper as the third arm on the left-hand side.   We began our dissection the posterior plane incising the peritoneum at the level of the vas deferens.  Because of his very narrow pelvis I was unable to get all the way down to the area of the vas deferens and had a very difficult time identifying the structures.  As such, I opted to forego this dissection.    At this point the bladder was taken down starting at the urachal remnant with a combination of both blunt dissection and sharp dissection using monopolar cautery the bladder was dropped down in the usual fashion to the medial umbilical ligaments laterally and the dorsal vein of the prostate anteriorly creating our space of Retzius. We then turned our attention to the endopelvic fascia which was incised laterally starting on the patient's right-hand side the levator muscles were pushed off the prostate laterally up towards the dorsal vein complex on the right-hand side. This process was then repeated  on the left-hand side and a nice notch was created for the dorsal vein. I then used a 63mm stapler to staple the dorsal vein.   We,the first assistant and I, then located the bladder neck at the vesicoprostatic junction and using the monopolar scissors dissected down through the perivesical tissues and the bladder  neck down to the prostatic urethra. The catheter was then deflated and pulled through our urethral opening and then used to retract the prostate anteriorly for the posterior bladder neck dissection. Once through the bladder neck and into the posterior plane of the prostate, I was able to identify the vas deferens on the right and dissected it free before transecting it 3 cm from the prostate.  The SV on the right was then dissected out and the blood supply was clipped prior to transecting it at the tip.  I then identified the vas deferens and the seminal vesicle on the left side dissected these out in a similar fashion.  The left pedicle was then isolated and systematically ligated with Weck clips and scissors. The nerve bundle was then peeled off the posterior lateral aspect of the prostate and bluntly dissected away off the prostate. This was then repeated on the right side.    I then came down through the dorsal venous complex anteriorly down to the membranous urethra using the monopolar. Once down to the urethra, it was transected sharply and the apex of the prostate was then dissected off the levator and rectourethralis muscles. Once the apex of the prostate had been dissected free we came back to the base of the prostate and bluntly push the rectum and nerve vascular bundle off the prostate the patient's left and used clips on the patient's right to free the prostate. Once the prostate was free it was placed off to the side. The pelvis was then irrigated with normal saline and noted to be relatively hemostatic.  Attention was then turned to the right pelvic sidewall. The fibrofatty tissue between the external iliac vein, confluence of the iliac vessels, hypogastric artery, and Cooper's ligament was dissected free from the pelvic sidewall with care to preserve the obturator nerve. Weck clips were used for lymphostasis and hemostasis. An identical procedure was performed on the contralateral side and the  lymphatic packets were removed for permanent pathologic analysis.  The prostate and both lymph node tissues were placed in the Endo Catch bag and the string brought to the 5 mm port.    Performed a Rocco stitch using a 2-0 V-Loc suture.  The vesicourethral anastomosis was then completed with 2 interlocking 3-0 V. lock sutures running the anastomosis in the 6:00 position to the 12:00 position on each side and then tying it off on the top.  I did have to do a modified tennis racquet stitch at the 12 o'clock position because of the size of the patient's bladder neck.  However, the final catheter was then passed through the patient's urethra and into the bladder and 120 cc was instilled into the bladder to test the anastomosis. As there was no leak a 83 [place] Blake drain was passed through the left lateral port and placed around the vesicourethral anastomosis. A 12 mm assistant port on the right lateral side was then closed with 0 Vicryl with the help of the [employer] needle. The 12 mm midline infraumbilical incision was then extended another centimeter taken down and the fascia opened to remove the Endo Catch bag with the prostate specimen. The fascia was  then closed with a 0 Vicryl and all skin ports were closed with 4-0 Monocryl in a subcutaneous fashion. Dermabond glue was then applied to the incisions. The drain was then secured to the skin with a 0 nylon stitch and dressing applied.   At the end of the case all laps needles and sponges had been accounted for. There no immediate complications. The patient returned to the PACU in stable condition.

## 2021-04-21 NOTE — Anesthesia Procedure Notes (Signed)
Procedure Name: Intubation Date/Time: 04/21/2021 8:39 AM Performed by: Sharlette Dense, CRNA Pre-anesthesia Checklist: Patient identified, Emergency Drugs available, Suction available and Patient being monitored Patient Re-evaluated:Patient Re-evaluated prior to induction Oxygen Delivery Method: Circle system utilized Preoxygenation: Pre-oxygenation with 100% oxygen Induction Type: IV induction Ventilation: Two handed mask ventilation required and Oral airway inserted - appropriate to patient size Laryngoscope Size: Glidescope and 4 Grade View: Grade I Tube type: Parker flex tip Tube size: 7.5 mm Number of attempts: 1 Airway Equipment and Method: Rigid stylet, Video-laryngoscopy and Oral airway Placement Confirmation: ETT inserted through vocal cords under direct vision, positive ETCO2 and breath sounds checked- equal and bilateral Secured at: 23 cm Tube secured with: Tape Dental Injury: Teeth and Oropharynx as per pre-operative assessment  Difficulty Due To: Difficulty was anticipated, Difficult Airway- due to large tongue, Difficult Airway- due to reduced neck mobility and Difficult Airway- due to anterior larynx

## 2021-04-22 ENCOUNTER — Encounter (HOSPITAL_COMMUNITY): Payer: Self-pay | Admitting: Urology

## 2021-04-22 DIAGNOSIS — C61 Malignant neoplasm of prostate: Secondary | ICD-10-CM | POA: Diagnosis not present

## 2021-04-22 LAB — CBC
HCT: 34.3 % — ABNORMAL LOW (ref 39.0–52.0)
Hemoglobin: 11.4 g/dL — ABNORMAL LOW (ref 13.0–17.0)
MCH: 28.6 pg (ref 26.0–34.0)
MCHC: 33.2 g/dL (ref 30.0–36.0)
MCV: 86 fL (ref 80.0–100.0)
Platelets: 297 10*3/uL (ref 150–400)
RBC: 3.99 MIL/uL — ABNORMAL LOW (ref 4.22–5.81)
RDW: 14 % (ref 11.5–15.5)
WBC: 11 10*3/uL — ABNORMAL HIGH (ref 4.0–10.5)
nRBC: 0 % (ref 0.0–0.2)

## 2021-04-22 LAB — BASIC METABOLIC PANEL
Anion gap: 5 (ref 5–15)
BUN: 12 mg/dL (ref 6–20)
CO2: 27 mmol/L (ref 22–32)
Calcium: 8.8 mg/dL — ABNORMAL LOW (ref 8.9–10.3)
Chloride: 111 mmol/L (ref 98–111)
Creatinine, Ser: 0.67 mg/dL (ref 0.61–1.24)
GFR, Estimated: >60 mL/min (ref >60)
Glucose, Bld: 104 mg/dL — ABNORMAL HIGH (ref 70–99)
Potassium: 3.6 mmol/L (ref 3.5–5.1)
Sodium: 143 mmol/L (ref 135–145)

## 2021-04-22 MED ORDER — TRAMADOL HCL 50 MG PO TABS: 50.0000 mg | ORAL_TABLET | Freq: Four times a day (QID) | ORAL | 0 refills | Status: DC | PRN

## 2021-04-22 MED ORDER — SULFAMETHOXAZOLE-TRIMETHOPRIM 800-160 MG PO TABS: 1.0000 | ORAL_TABLET | Freq: Two times a day (BID) | ORAL | 0 refills | Status: DC

## 2021-04-22 NOTE — Progress Notes (Signed)
Pt to be discharged to home today. Pt and Pt's Mother given discharge teaching including all discharge Medications and schedules for theses Medications. Pt and Pt's Mother also given home teaching for foley care and leg bag teaching. Understanding verbalized of all discharge teaching. Discharge AVS with Pt at time of discharge. VSS and no acute changes noted at time of discharge

## 2021-04-22 NOTE — Discharge Summary (Addendum)
[facility] Discharge Summary  Admit date: 04/21/2021  Discharge date and time: 04/22/21   Discharge to: Home  Discharge Service: Urology  Discharge Attending Physician:  Louis Meckel, MD  Discharge  Diagnoses: Prostate Cancer  Secondary Diagnosis: Active Problems:   Prostate cancer [facility])   OR Procedures: Procedure(s): XI ROBOTIC ASSISTED LAPAROSCOPIC RADICAL PROSTATECTOMY BILATERAL PELVIC LYMPH NODE DISSECTION 04/21/2021   Ancillary Procedures: None   Discharge Day Services: The patient was seen and examined by the Urology team both in the morning and immediately prior to discharge.  Vital signs and laboratory values were stable and within normal limits.  The physical exam was benign and unchanged and all surgical wounds were examined.  Discharge instructions were explained and all questions answered.  Subjective  No acute events overnight. Pain Controlled. No fever or chills.  Objective Patient Vitals for the past 8 hrs:  BP Temp Temp src Pulse Resp SpO2  04/22/21 0401 122/66 98.5 F (36.9 C) Oral 73 20 100 %  04/21/21 2355 118/70 99.3 F (37.4 C) Oral 87 18 97 %   No intake/output data recorded.  General Appearance:        No acute distress Lungs:                       Normal work of breathing on room air Heart:                                Regular rate and rhythm Abdomen:                         Soft, appropriately tender, non-distended, incisions c/d/I GU:        Foley catheter in place draining clear yellow urine Extremities:                      Warm and well perfused   Hospital Course:  The patient underwent RALP on 04/21/2021.  The patient tolerated the procedure well, was extubated in the OR, and afterwards was taken to the PACU for routine post-surgical care. When stable the patient was transferred to the floor.   The patient did well postoperatively.  The patient's diet was slowly advanced and at the time of discharge was tolerating a regular diet.   The patient was discharged home 1 Day Post-Op, at which point was tolerating a regular solid diet, was tolerating his catheter well, have adequate pain control with P.O. pain medication, and could ambulate without difficulty. The patient will follow up with [place] for post op check.   Condition at Discharge: Improved  Discharge Medications:  Allergies as of 04/22/2021   No Known Allergies      Medication List     TAKE these medications    atorvastatin 20 MG tablet Commonly known as: LIPITOR Take 20 mg by mouth daily.   lisinopril-hydrochlorothiazide 10-12.5 MG tablet Commonly known as: ZESTORETIC Take 1 tablet by mouth daily.   sulfamethoxazole-trimethoprim 800-160 MG tablet Commonly known as: BACTRIM DS Take 1 tablet by mouth 2 (two) times daily. Start taking one day prior to your appointment for your first follow-up and catheter removal.  Continue taking for three days.   traMADol 50 MG tablet Commonly known as: Ultram Take 1-2 tablets (50-100 mg total) by mouth every 6 (six) hours as needed for moderate pain.

## 2021-04-27 LAB — SURGICAL PATHOLOGY

## 2021-12-18 ENCOUNTER — Other Ambulatory Visit: Payer: Self-pay

## 2021-12-18 ENCOUNTER — Encounter (HOSPITAL_COMMUNITY): Payer: Self-pay | Admitting: *Deleted

## 2021-12-18 ENCOUNTER — Ambulatory Visit (HOSPITAL_COMMUNITY)
Admission: EM | Admit: 2021-12-18 | Discharge: 2021-12-18 | Disposition: A | Discharge status: Home / Self Care | Payer: 59 | Attending: Family Medicine | Admitting: Family Medicine

## 2021-12-18 DIAGNOSIS — E785 Hyperlipidemia, unspecified: Secondary | ICD-10-CM

## 2021-12-18 DIAGNOSIS — I10 Essential (primary) hypertension: Secondary | ICD-10-CM

## 2021-12-18 MED ORDER — ATORVASTATIN CALCIUM 20 MG PO TABS: 20.0000 mg | ORAL_TABLET | Freq: Every day | ORAL | 1 refills | Status: DC

## 2021-12-18 MED ORDER — LISINOPRIL-HYDROCHLOROTHIAZIDE 10-12.5 MG PO TABS: 1.0000 | ORAL_TABLET | Freq: Every day | ORAL | 1 refills | Status: DC

## 2021-12-18 NOTE — ED Triage Notes (Signed)
Refill Lisinopril and Atorvasatin. Pt does not has a PCP.

## 2021-12-20 NOTE — ED Provider Notes (Signed)
  [place]   [id number] 12/18/21 Arrival Time: 9024  ASSESSMENT & PLAN:  1. Essential hypertension   2. Hyperlipidemia, unspecified hyperlipidemia type    Reports as stable.  Meds ordered this encounter  Medications   atorvastatin (LIPITOR) 20 MG tablet    Sig: Take 1 tablet (20 mg total) by mouth daily.    Dispense:  30 tablet    Refill:  1   lisinopril-hydrochlorothiazide (ZESTORETIC) 10-12.5 MG tablet    Sig: Take 1 tablet by mouth daily.    Dispense:  30 tablet    Refill:  1   Working to est care with PCP.  Reviewed expectations re: course of current medical issues. Questions answered. Outlined signs and symptoms indicating need for more acute intervention. Patient verbalized understanding. After Visit Summary given.   SUBJECTIVE: History from: patient. Jesus Hamilton is a 51 y.o. male who presents requesting medication refill. No current concerns.  Current medical problems include: Past Medical History:  Diagnosis Date   COVID-19 03/15/2021   GERD (gastroesophageal reflux disease)    Hyperlipidemia    Hypertension    Prostate cancer ([place])     No current facility-administered medications for this encounter.  Current Outpatient Medications:    atorvastatin (LIPITOR) 20 MG tablet, Take 1 tablet (20 mg total) by mouth daily., Disp: 30 tablet, Rfl: 1   lisinopril-hydrochlorothiazide (ZESTORETIC) 10-12.5 MG tablet, Take 1 tablet by mouth daily., Disp: 30 tablet, Rfl: 1   OBJECTIVE:  Vitals:   12/18/21 1055  BP: 135/87  Pulse: 66  Resp: 18  Temp: 98.4 F (36.9 C)  SpO2: 96%    General appearance: alert; no distress Lungs: clear to auscultation bilaterally Heart: regular rate and rhythm Extremities: no cyanosis or edema; symmetrical with no gross deformities Skin: warm and dry Psychological: alert and cooperative; normal mood and affect   No Known Allergies  Social History   Socioeconomic History   Marital status: Divorced    Spouse  name: Not on file   Number of children: Not on file   Years of education: Not on file   Highest education level: Not on file  Occupational History   Not on file  Tobacco Use   Smoking status: Never   Smokeless tobacco: Never  Vaping Use   Vaping Use: Never used  Substance and Sexual Activity   Alcohol use: Yes    Comment: Once a month   Drug use: Not Currently   Sexual activity: Not on file  Other Topics Concern   Not on file  Social History Narrative   Not on file   Social Determinants of Health   Financial Resource Strain: Not on file  Food Insecurity: Not on file  Transportation Needs: Not on file  Physical Activity: Not on file  Stress: Not on file  Social Connections: Not on file  Intimate Partner Violence: Not on file   History reviewed. No pertinent family history. Past Surgical History:  Procedure Laterality Date   HAND SURGERY Right    PELVIC LYMPH NODE DISSECTION Bilateral 04/21/2021   Procedure: BILATERAL PELVIC LYMPH NODE DISSECTION;  Surgeon: Ardis Hughs, MD;  Location: [facility];  Service: Urology;  Laterality: Bilateral;   PROSTATE BIOPSY     ROBOT ASSISTED LAPAROSCOPIC RADICAL PROSTATECTOMY N/A 04/21/2021   Procedure: XI ROBOTIC ASSISTED LAPAROSCOPIC RADICAL PROSTATECTOMY;  Surgeon: Ardis Hughs, MD;  Location: [facility];  Service: Urology;  Laterality: N/AVanessa Kick, MD 12/20/21 [phone]

## 2021-12-31 ENCOUNTER — Ambulatory Visit (INDEPENDENT_AMBULATORY_CARE_PROVIDER_SITE_OTHER): Payer: 59 | Admitting: Primary Care

## 2021-12-31 ENCOUNTER — Encounter (INDEPENDENT_AMBULATORY_CARE_PROVIDER_SITE_OTHER): Payer: Self-pay | Admitting: Primary Care

## 2021-12-31 VITALS — BP 126/81 | HR 66 | Temp 97.8°F | Ht 72.05 in | Wt 252.0 lb

## 2021-12-31 DIAGNOSIS — Z131 Encounter for screening for diabetes mellitus: Secondary | ICD-10-CM

## 2021-12-31 DIAGNOSIS — I10 Essential (primary) hypertension: Secondary | ICD-10-CM

## 2021-12-31 DIAGNOSIS — E782 Mixed hyperlipidemia: Secondary | ICD-10-CM

## 2021-12-31 DIAGNOSIS — R351 Nocturia: Secondary | ICD-10-CM | POA: Diagnosis not present

## 2021-12-31 DIAGNOSIS — R7303 Prediabetes: Secondary | ICD-10-CM

## 2021-12-31 DIAGNOSIS — C61 Malignant neoplasm of prostate: Secondary | ICD-10-CM

## 2021-12-31 DIAGNOSIS — Z6834 Body mass index (BMI) 34.0-34.9, adult: Secondary | ICD-10-CM

## 2021-12-31 LAB — POCT GLYCOSYLATED HEMOGLOBIN (HGB A1C): Hemoglobin A1C: 5.8 % — AB (ref 4.0–5.6)

## 2021-12-31 MED ORDER — LISINOPRIL-HYDROCHLOROTHIAZIDE 10-12.5 MG PO TABS: 1.0000 | ORAL_TABLET | Freq: Every day | ORAL | 1 refills | Status: DC

## 2021-12-31 NOTE — Progress Notes (Signed)
[facility] Patient Office Visit  Subjective    Patient ID: Jesus Hamilton, male    DOB: [birth year]  Age: 51 y.o. MRN: [id number]  CC:  Chief Complaint  Patient presents with   New Patient (Initial Visit)    HTN   Pt is fasting    Medication Refill    HPI Jesus Hamilton presents to establish care. He has significant hx of HTN, colon cancer- treated, and hyperlipidemia. Today voices no concerns other then refills on medications. Patient has No headache, No chest pain, No abdominal pain - No Nausea, No new weakness tingling or numbness, No Cough - shortness of breath    Outpatient Encounter Medications as of 12/31/2021  Medication Sig   atorvastatin (LIPITOR) 20 MG tablet Take 1 tablet (20 mg total) by mouth daily.   lisinopril-hydrochlorothiazide (ZESTORETIC) 10-12.5 MG tablet Take 1 tablet by mouth daily.   No facility-administered encounter medications on file as of 12/31/2021.    Past Medical History:  Diagnosis Date   COVID-19 03/15/2021   GERD (gastroesophageal reflux disease)    Hyperlipidemia    Hypertension    Prostate cancer [facility])     Past Surgical History:  Procedure Laterality Date   HAND SURGERY Right    PELVIC LYMPH NODE DISSECTION Bilateral 04/21/2021   Procedure: BILATERAL PELVIC LYMPH NODE DISSECTION;  Surgeon: Ardis Hughs, MD;  Location: [facility];  Service: Urology;  Laterality: Bilateral;   PROSTATE BIOPSY     ROBOT ASSISTED LAPAROSCOPIC RADICAL PROSTATECTOMY N/A 04/21/2021   Procedure: XI ROBOTIC ASSISTED LAPAROSCOPIC RADICAL PROSTATECTOMY;  Surgeon: Ardis Hughs, MD;  Location: [facility];  Service: Urology;  Laterality: N/A;    No family history on file.  Social History   Socioeconomic History   Marital status: Divorced    Spouse name: Not on file   Number of children: Not on file   Years of education: Not on file   Highest education level: Not on file  Occupational History   Not on file  Tobacco Use   Smoking  status: Never   Smokeless tobacco: Never  Vaping Use   Vaping Use: Never used  Substance and Sexual Activity   Alcohol use: Yes    Comment: Once a month   Drug use: Not Currently   Sexual activity: Not on file  Other Topics Concern   Not on file  Social History Narrative   Not on file   Social Determinants of Health   Financial Resource Strain: Not on file  Food Insecurity: Not on file  Transportation Needs: Not on file  Physical Activity: Not on file  Stress: Not on file  Social Connections: Not on file  Intimate Partner Violence: Not on file   ROS Comprehensive ROS Pertinent positive and negative noted in HPI       Objective    BP 126/81   Pulse 66   Temp 97.8 F (36.6 C) (Oral)   Ht 6' 0.05" (1.83 m)   Wt 252 lb (114.3 kg)   SpO2 98%   BMI 34.13 kg/m   Physical Exam Vitals reviewed.  Constitutional:      Appearance: He is obese.  HENT:     Head: Normocephalic.     Right Ear: Tympanic membrane and external ear normal.     Left Ear: Tympanic membrane and external ear normal.     Nose: Nose normal.  Eyes:     Extraocular Movements: Extraocular movements intact.  Conjunctiva/sclera: Conjunctivae normal.     Pupils: Pupils are equal, round, and reactive to light.  Cardiovascular:     Rate and Rhythm: Normal rate and regular rhythm.  Pulmonary:     Effort: Pulmonary effort is normal.     Breath sounds: Normal breath sounds.  Abdominal:     General: Bowel sounds are normal. There is distension.     Palpations: Abdomen is soft.  Musculoskeletal:        General: Normal range of motion.     Cervical back: Normal range of motion and neck supple.  Skin:    General: Skin is warm and dry.  Neurological:     Mental Status: He is alert and oriented to person, place, and time.  Psychiatric:        Mood and Affect: Mood normal.        Behavior: Behavior normal.        Thought Content: Thought content normal.        Judgment: Judgment normal.     Assessment & Plan:  Paolo was seen today for new patient (initial visit) and medication refill.  Diagnoses and all orders for this visit:  Prostate cancer ([place]) HX OF TREATED  Essential hypertension blood pressure goal   is met of less than 130/80, low-sodium, DASH diet, medication compliance, 150 minutes of moderate intensity exercise per week. Discussed medication compliance, adverse effects.  -     Comprehensive metabolic panel -     CBC with Differential/Platelet  Mixed hyperlipidemia  Healthy lifestyle diet of fruits vegetables fish nuts whole grains and low saturated fat . Foods high in cholesterol or liver, fatty meats,cheese, butter avocados, nuts and seeds, chocolate and fried foods. -     Lipid panel  Class 2 severe obesity due to excess calories with serious comorbidity in adult, unspecified BMI ([place]) Obesity is 30-39 indicating an excess in caloric intake or underlining conditions. This may lead to other co-morbidities. Lifestyle modifications of diet and exercise may reduce obesity.    Screening for diabetes mellitus -     HgB A1c 5.8  Nocturia -     PSA  Prediabetes New dx ADA guidelines for prediabetes 5.7 -6.4 . Diabetic education A1C is a measure of your sugar over the past 3 months and is not affected by what you have eaten over the past few days. Diabetes increases your chances of stroke and heart attack over 300 % and is the leading cause of blindness and kidney failure in the [place]. Please make sure you decrease bad carbs like white bread, white rice, potatoes, corn, soft drinks, pasta, cereals, refined sugars, sweet tea, dried fruits, and fruit juice. Good carbs are okay to eat in moderation like sweet potatoes, brown rice, whole grain pasta/bread, most fruit (except dried fruit) and you can eat as many veggies as you want.   Greater than 6.5 is considered diabetic. Between 6.4 and 5.7 is prediabetic If your A1C is less than 5.7 you are NOT  diabetic.  Targets for Glucose Readings: Time of Check Target for patients WITHOUT Diabetes Target for DIABETICS  Before Meals Less than 100  less than 150  Two hours after meals Less than 200  Less than 250  Agreed lifestyle modifications which would include diet modification and exercising 30 minutes daily or 150 weekly.  Reevaluate A1c in 6 months  Other orders/medication refills -     lisinopril-hydrochlorothiazide (ZESTORETIC) 10-12.5 MG tablet; Take 1 tablet by mouth daily.  Kerin Perna, NP

## 2022-01-01 LAB — CBC WITH DIFFERENTIAL/PLATELET
Basophils Absolute: 0 10*3/uL (ref 0.0–0.2)
Basos: 0 %
EOS (ABSOLUTE): 0.4 10*3/uL (ref 0.0–0.4)
Eos: 6 %
Hematocrit: 46.1 % (ref 37.5–51.0)
Hemoglobin: 14.8 g/dL (ref 13.0–17.7)
Immature Grans (Abs): 0 10*3/uL (ref 0.0–0.1)
Immature Granulocytes: 0 %
Lymphocytes Absolute: 1.8 10*3/uL (ref 0.7–3.1)
Lymphs: 27 %
MCH: 27.5 pg (ref 26.6–33.0)
MCHC: 32.1 g/dL (ref 31.5–35.7)
MCV: 86 fL (ref 79–97)
Monocytes Absolute: 0.8 10*3/uL (ref 0.1–0.9)
Monocytes: 13 %
Neutrophils Absolute: 3.4 10*3/uL (ref 1.4–7.0)
Neutrophils: 54 %
Platelets: 242 10*3/uL (ref 150–450)
RBC: 5.38 x10E6/uL (ref 4.14–5.80)
RDW: 14.3 % (ref 11.6–15.4)
WBC: 6.4 10*3/uL (ref 3.4–10.8)

## 2022-01-01 LAB — COMPREHENSIVE METABOLIC PANEL
ALT: 24 IU/L (ref 0–44)
AST: 20 IU/L (ref 0–40)
Albumin/Globulin Ratio: 1.6 (ref 1.2–2.2)
Albumin: 4.6 g/dL (ref 3.8–4.9)
Alkaline Phosphatase: 70 IU/L (ref 44–121)
BUN/Creatinine Ratio: 18 (ref 9–20)
BUN: 14 mg/dL (ref 6–24)
Bilirubin Total: 0.4 mg/dL (ref 0.0–1.2)
CO2: 25 mmol/L (ref 20–29)
Calcium: 9.5 mg/dL (ref 8.7–10.2)
Chloride: 99 mmol/L (ref 96–106)
Creatinine, Ser: 0.76 mg/dL (ref 0.76–1.27)
Globulin, Total: 2.9 g/dL (ref 1.5–4.5)
Glucose: 88 mg/dL (ref 70–99)
Potassium: 4 mmol/L (ref 3.5–5.2)
Sodium: 139 mmol/L (ref 134–144)
Total Protein: 7.5 g/dL (ref 6.0–8.5)
eGFR: 109 mL/min/{1.73_m2} (ref >59)

## 2022-01-01 LAB — LIPID PANEL
Chol/HDL Ratio: 3.8 ratio (ref 0.0–5.0)
Cholesterol, Total: 181 mg/dL (ref 100–199)
HDL: 48 mg/dL (ref >39)
LDL Chol Calc (NIH): 121 mg/dL — ABNORMAL HIGH (ref 0–99)
Triglycerides: 64 mg/dL (ref 0–149)
VLDL Cholesterol Cal: 12 mg/dL (ref 5–40)

## 2022-01-01 LAB — PSA: Prostate Specific Ag, Serum: <0.1 ng/mL (ref 0.0–4.0)

## 2022-01-12 ENCOUNTER — Telehealth (INDEPENDENT_AMBULATORY_CARE_PROVIDER_SITE_OTHER): Payer: Self-pay

## 2022-01-12 NOTE — Telephone Encounter (Signed)
Patient verified date of birth. He is aware of lab results. Nat Christen, CMA

## 2022-01-12 NOTE — Telephone Encounter (Signed)
-----   Message from Kerin Perna, NP sent at 01/12/2022  1:47 PM EDT ----- Labs are  normal except LDL. The LDL is the bad cholesterol. Over time and in combination with inflammation and other factors, this contributes to plaque which in turn may lead to stroke and/or heart attack down the road.  To reduce your LDL, Remember - more fruits and vegetables, more fish, and limit red meat and dairy products. More soy, nuts, beans, barley, lentils, oats and plant sterol ester enriched margarine instead of butter. I also encourage eliminating sugar and processed food. We should recheck your cholesterol in 3-6 months.   Try to drink at least 48 oz of water per day. Work on eating a low fat, heart healthy diet and participate in regular aerobic exercise program to control as well. Exercise at least  30 minutes per day-5 days per week.

## 2022-04-19 ENCOUNTER — Other Ambulatory Visit (INDEPENDENT_AMBULATORY_CARE_PROVIDER_SITE_OTHER): Payer: Self-pay | Admitting: Primary Care

## 2022-04-19 NOTE — Telephone Encounter (Signed)
Requested medication (s) are due for refill today: yes  Requested medication (s) are on the active medication list: yes    Last refill: 12/18/21  #30  1 refill  Future visit scheduled yes 07/01/22  Notes to clinic:Historical provider. Please review.  Requested Prescriptions  Pending Prescriptions Disp Refills   atorvastatin (LIPITOR) 20 MG tablet 30 tablet 1    Sig: Take 1 tablet (20 mg total) by mouth daily.     Cardiovascular:  Antilipid - Statins Failed - 04/19/2022  3:57 PM      Failed - Lipid Panel in normal range within the last 12 months    Cholesterol, Total  Date Value Ref Range Status  12/31/2021 181 100 - 199 mg/dL Final   LDL Chol Calc (NIH)  Date Value Ref Range Status  12/31/2021 121 (H) 0 - 99 mg/dL Final   HDL  Date Value Ref Range Status  12/31/2021 48 >39 mg/dL Final   Triglycerides  Date Value Ref Range Status  12/31/2021 64 0 - 149 mg/dL Final         Passed - Patient is not pregnant      Passed - Valid encounter within last 12 months    Recent Outpatient Visits           3 months ago Prostate cancer ([place])   [place], [place], NP       Future Appointments             In 2 months Oletta Lamas, [place] Cage, NP [place]

## 2022-04-19 NOTE — Telephone Encounter (Signed)
Medication Refill - Medication: atorvastatin (LIPITOR) 20 MG tablet   Has the patient contacted their pharmacy? Yes.   (Agent: If no, request that the patient contact the pharmacy for the refill. If patient does not wish to contact the pharmacy document the reason why and proceed with request.) (Agent: If yes, when and what did the pharmacy advise?) Request sent to office  Preferred Pharmacy (with phone number or street name): [place] ([place]), [place] - [address]  [address] [facility] [place] [address]  Phone:  [phone]  Fax:  [phone]  DEA #:  -- Has the patient been seen for an appointment in the last year OR does the patient have an upcoming appointment? Yes.    Agent: Please be advised that RX refills may take up to 3 business days. We ask that you follow-up with your pharmacy.

## 2022-04-20 MED ORDER — ATORVASTATIN CALCIUM 20 MG PO TABS: 20.0000 mg | ORAL_TABLET | Freq: Every day | ORAL | 1 refills | Status: DC

## 2022-06-11 ENCOUNTER — Other Ambulatory Visit (INDEPENDENT_AMBULATORY_CARE_PROVIDER_SITE_OTHER): Payer: Self-pay | Admitting: Primary Care

## 2022-06-12 NOTE — Telephone Encounter (Signed)
Needs appt and labs for refills

## 2022-07-01 ENCOUNTER — Encounter (INDEPENDENT_AMBULATORY_CARE_PROVIDER_SITE_OTHER): Payer: Self-pay | Admitting: Primary Care

## 2022-07-01 ENCOUNTER — Ambulatory Visit (INDEPENDENT_AMBULATORY_CARE_PROVIDER_SITE_OTHER): Payer: Commercial Managed Care - HMO | Admitting: Primary Care

## 2022-07-01 VITALS — BP 122/76 | HR 72 | Resp 16 | Ht 72.0 in | Wt 245.8 lb

## 2022-07-01 DIAGNOSIS — I10 Essential (primary) hypertension: Secondary | ICD-10-CM

## 2022-07-01 DIAGNOSIS — E782 Mixed hyperlipidemia: Secondary | ICD-10-CM

## 2022-07-01 DIAGNOSIS — Z1159 Encounter for screening for other viral diseases: Secondary | ICD-10-CM

## 2022-07-01 DIAGNOSIS — Z114 Encounter for screening for human immunodeficiency virus [HIV]: Secondary | ICD-10-CM

## 2022-07-01 DIAGNOSIS — E66812 Obesity, class 2: Secondary | ICD-10-CM

## 2022-07-01 DIAGNOSIS — Z1211 Encounter for screening for malignant neoplasm of colon: Secondary | ICD-10-CM

## 2022-07-01 MED ORDER — LISINOPRIL-HYDROCHLOROTHIAZIDE 10-12.5 MG PO TABS: 1.0000 | ORAL_TABLET | Freq: Every day | ORAL | 1 refills | Status: DC

## 2022-07-01 NOTE — Patient Instructions (Signed)
Calorie Counting for Weight Loss Calories are units of energy. Your body needs a certain number of calories from food to keep going throughout the day. When you eat or drink more calories than your body needs, your body stores the extra calories mostly as fat. When you eat or drink fewer calories than your body needs, your body burns fat to get the energy it needs. Calorie counting means keeping track of how many calories you eat and drink each day. Calorie counting can be helpful if you need to lose weight. If you eat fewer calories than your body needs, you should lose weight. Ask your health care provider what a healthy weight is for you. For calorie counting to work, you will need to eat the right number of calories each day to lose a healthy amount of weight per week. A dietitian can help you figure out how many calories you need in a day and will suggest ways to reach your calorie goal. A healthy amount of weight to lose each week is usually 1-2 lb (0.5-0.9 kg). This usually means that your daily calorie intake should be reduced by 500-750 calories. Eating 1,200-1,500 calories a day can help most women lose weight. Eating 1,500-1,800 calories a day can help most men lose weight. What do I need to know about calorie counting? Work with your health care provider or dietitian to determine how many calories you should get each day. To meet your daily calorie goal, you will need to: Find out how many calories are in each food that you would like to eat. Try to do this before you eat. Decide how much of the food you plan to eat. Keep a food log. Do this by writing down what you ate and how many calories it had. To successfully lose weight, it is important to balance calorie counting with a healthy lifestyle that includes regular activity. Where do I find calorie information?  The number of calories in a food can be found on a Nutrition Facts label. If a food does not have a Nutrition Facts label, try  to look up the calories online or ask your dietitian for help. Remember that calories are listed per serving. If you choose to have more than one serving of a food, you will have to multiply the calories per serving by the number of servings you plan to eat. For example, the label on a package of bread might say that a serving size is 1 slice and that there are 90 calories in a serving. If you eat 1 slice, you will have eaten 90 calories. If you eat 2 slices, you will have eaten 180 calories. How do I keep a food log? After each time that you eat, record the following in your food log as soon as possible: What you ate. Be sure to include toppings, sauces, and other extras on the food. How much you ate. This can be measured in cups, ounces, or number of items. How many calories were in each food and drink. The total number of calories in the food you ate. Keep your food log near you, such as in a pocket-sized notebook or on an app or website on your mobile phone. Some programs will calculate calories for you and show you how many calories you have left to meet your daily goal. What are some portion-control tips? Know how many calories are in a serving. This will help you know how many servings you can have of a certain   food. Use a measuring cup to measure serving sizes. You could also try weighing out portions on a kitchen scale. With time, you will be able to estimate serving sizes for some foods. Take time to put servings of different foods on your favorite plates or in your favorite bowls and cups so you know what a serving looks like. Try not to eat straight from a food's packaging, such as from a bag or box. Eating straight from the package makes it hard to see how much you are eating and can lead to overeating. Put the amount you would like to eat in a cup or on a plate to make sure you are eating the right portion. Use smaller plates, glasses, and bowls for smaller portions and to prevent  overeating. Try not to multitask. For example, avoid watching TV or using your computer while eating. If it is time to eat, sit down at a table and enjoy your food. This will help you recognize when you are full. It will also help you be more mindful of what and how much you are eating. What are tips for following this plan? Reading food labels Check the calorie count compared with the serving size. The serving size may be smaller than what you are used to eating. Check the source of the calories. Try to choose foods that are high in protein, fiber, and vitamins, and low in saturated fat, trans fat, and sodium. Shopping Read nutrition labels while you shop. This will help you make healthy decisions about which foods to buy. Pay attention to nutrition labels for low-fat or fat-free foods. These foods sometimes have the same number of calories or more calories than the full-fat versions. They also often have added sugar, starch, or salt to make up for flavor that was removed with the fat. Make a grocery list of lower-calorie foods and stick to it. Cooking Try to cook your favorite foods in a healthier way. For example, try baking instead of frying. Use low-fat dairy products. Meal planning Use more fruits and vegetables. One-half of your plate should be fruits and vegetables. Include lean proteins, such as chicken, turkey, and fish. Lifestyle Each week, aim to do one of the following: 150 minutes of moderate exercise, such as walking. 75 minutes of vigorous exercise, such as running. General information Know how many calories are in the foods you eat most often. This will help you calculate calorie counts faster. Find a way of tracking calories that works for you. Get creative. Try different apps or programs if writing down calories does not work for you. What foods should I eat?  Eat nutritious foods. It is better to have a nutritious, high-calorie food, such as an avocado, than a food with  few nutrients, such as a bag of potato chips. Use your calories on foods and drinks that will fill you up and will not leave you hungry soon after eating. Examples of foods that fill you up are nuts and nut butters, vegetables, lean proteins, and high-fiber foods such as whole grains. High-fiber foods are foods with more than 5 g of fiber per serving. Pay attention to calories in drinks. Low-calorie drinks include water and unsweetened drinks. The items listed above may not be a complete list of foods and beverages you can eat. Contact a dietitian for more information. What foods should I limit? Limit foods or drinks that are not good sources of vitamins, minerals, or protein or that are high in unhealthy fats. These   include: Candy. Other sweets. Sodas, specialty coffee drinks, alcohol, and juice. The items listed above may not be a complete list of foods and beverages you should avoid. Contact a dietitian for more information. How do I count calories when eating out? Pay attention to portions. Often, portions are much larger when eating out. Try these tips to keep portions smaller: Consider sharing a meal instead of getting your own. If you get your own meal, eat only half of it. Before you start eating, ask for a container and put half of your meal into it. When available, consider ordering smaller portions from the menu instead of full portions. Pay attention to your food and drink choices. Knowing the way food is cooked and what is included with the meal can help you eat fewer calories. If calories are listed on the menu, choose the lower-calorie options. Choose dishes that include vegetables, fruits, whole grains, low-fat dairy products, and lean proteins. Choose items that are boiled, broiled, grilled, or steamed. Avoid items that are buttered, battered, fried, or served with cream sauce. Items labeled as crispy are usually fried, unless stated otherwise. Choose water, low-fat milk,  unsweetened iced tea, or other drinks without added sugar. If you want an alcoholic beverage, choose a lower-calorie option, such as a glass of wine or light beer. Ask for dressings, sauces, and syrups on the side. These are usually high in calories, so you should limit the amount you eat. If you want a salad, choose a garden salad and ask for grilled meats. Avoid extra toppings such as bacon, cheese, or fried items. Ask for the dressing on the side, or ask for olive oil and vinegar or lemon to use as dressing. Estimate how many servings of a food you are given. Knowing serving sizes will help you be aware of how much food you are eating at restaurants. Where to find more information Centers for Disease Control and Prevention: www.cdc.gov U.S. Department of Agriculture: myplate.gov Summary Calorie counting means keeping track of how many calories you eat and drink each day. If you eat fewer calories than your body needs, you should lose weight. A healthy amount of weight to lose per week is usually 1-2 lb (0.5-0.9 kg). This usually means reducing your daily calorie intake by 500-750 calories. The number of calories in a food can be found on a Nutrition Facts label. If a food does not have a Nutrition Facts label, try to look up the calories online or ask your dietitian for help. Use smaller plates, glasses, and bowls for smaller portions and to prevent overeating. Use your calories on foods and drinks that will fill you up and not leave you hungry shortly after a meal. This information is not intended to replace advice given to you by your health care provider. Make sure you discuss any questions you have with your health care provider. Document Revised: 08/15/2019 Document Reviewed: 08/15/2019 Elsevier Patient Education  2023 Elsevier Inc.  

## 2022-07-01 NOTE — Progress Notes (Signed)
[place]   Jesus Hamilton is a 51 y.o. Body mass index is 33.34 kg/m. male presents for hypertension evaluation, Denies shortness of breath, headaches, chest pain or lower extremity edema, sudden onset, vision changes, unilateral weakness, dizziness, paresthesias   Patient reports adherence with medications.  Dietary habits include: Monitoring sodium intake Exercise habits include: Increase walking Family / Social history: Unknown   Past Medical History:  Diagnosis Date   COVID-19 03/15/2021   GERD (gastroesophageal reflux disease)    Hyperlipidemia    Hypertension    Prostate cancer [facility])    Past Surgical History:  Procedure Laterality Date   HAND SURGERY Right    PELVIC LYMPH NODE DISSECTION Bilateral 04/21/2021   Procedure: BILATERAL PELVIC LYMPH NODE DISSECTION;  Surgeon: Ardis Hughs, MD;  Location: [facility];  Service: Urology;  Laterality: Bilateral;   PROSTATE BIOPSY     ROBOT ASSISTED LAPAROSCOPIC RADICAL PROSTATECTOMY N/A 04/21/2021   Procedure: XI ROBOTIC ASSISTED LAPAROSCOPIC RADICAL PROSTATECTOMY;  Surgeon: Ardis Hughs, MD;  Location: [facility];  Service: Urology;  Laterality: N/A;   No Known Allergies Current Outpatient Medications on File Prior to Visit  Medication Sig Dispense Refill   atorvastatin (LIPITOR) 20 MG tablet Take 1 tablet by mouth once daily 30 tablet 0   No current facility-administered medications on file prior to visit.   Social History   Socioeconomic History   Marital status: Divorced    Spouse name: Not on file   Number of children: Not on file   Years of education: Not on file   Highest education level: Not on file  Occupational History   Not on file  Tobacco Use   Smoking status: Never   Smokeless tobacco: Never  Vaping Use   Vaping Use: Never used  Substance and Sexual Activity   Alcohol use: Yes    Comment: Once a month   Drug use: Not Currently   Sexual activity: Not on file  Other Topics  Concern   Not on file  Social History Narrative   Not on file   Social Determinants of Health   Financial Resource Strain: Not on file  Food Insecurity: Not on file  Transportation Needs: Not on file  Physical Activity: Not on file  Stress: Not on file  Social Connections: Not on file  Intimate Partner Violence: Not on file   History reviewed. No pertinent family history.   OBJECTIVE:  Vitals:   07/01/22 0915  BP: 122/76  Pulse: 72  Resp: 16  SpO2: 98%  Weight: 245 lb 12.8 oz (111.5 kg)  Height: 6' (1.829 m)    Physical Exam General: No apparent distress.  Obese male Eyes: Extraocular eye movements intact, pupils equal and round. Neck: Supple, trachea midline. Thyroid: No enlargement, mobile without fixation, no tenderness. Cardiovascular: Regular rhythm and rate, no murmur, normal radial pulses. Respiratory: Normal respiratory effort, clear to auscultation. Gastrointestinal: Normal pitch active bowel sounds, nontender abdomen without distention or appreciable hepatomegaly. Musculoskeletal: Normal muscle tone, no tenderness on palpation of tibia, no excessive thoracic kyphosis. Skin: Appropriate warmth, no visible rash. Mental status: Alert, conversant, speech clear, thought logical, appropriate mood and affect, no hallucinations or delusions evident. Hematologic/lymphatic: No cervical adenopathy, no visible ecchymoses.   ROS Comprehensive ROS Pertinent positive and negative noted in HPI   Last 3 Office BP readings: BP Readings from Last 3 Encounters:  07/01/22 122/76  12/31/21 126/81  12/18/21 135/87    BMET    Component Value Date/Time  NA 139 12/31/2021 1028   K 4.0 12/31/2021 1028   CL 99 12/31/2021 1028   CO2 25 12/31/2021 1028   GLUCOSE 88 12/31/2021 1028   GLUCOSE 104 (H) 04/22/2021 0446   BUN 14 12/31/2021 1028   CREATININE 0.76 12/31/2021 1028   CALCIUM 9.5 12/31/2021 1028   GFRNONAA >60 04/22/2021 0446    Renal function: CrCl cannot be  calculated (Patient's most recent lab result is older than the maximum 21 days allowed.).  Clinical ASCVD: No  The 10-year ASCVD risk score (Arnett DK, et al., 2019) is: 8.3%   Values used to calculate the score:     Age: 41 years     Sex: Male     Is Non-Hispanic African American: Yes     Diabetic: No     Tobacco smoker: No     Systolic Blood Pressure: 675 mmHg     Is BP treated: Yes     HDL Cholesterol: 48 mg/dL     Total Cholesterol: 181 mg/dL  ASCVD risk factors include- [place]   ASSESSMENT & PLAN:  1. Essential hypertension   2. Mixed hyperlipidemia   3. Class 2 severe obesity due to excess calories with serious comorbidity in adult, unspecified BMI ([place])   4. Colon cancer screening   5. Encounter for HCV screening test for low risk patient   6. Encounter for screening for HIV     Meds ordered this encounter  Medications   lisinopril-hydrochlorothiazide (ZESTORETIC) 10-12.5 MG tablet    Sig: Take 1 tablet by mouth daily.    Dispense:  90 tablet    Refill:  1    Order Specific Question:   Supervising Provider    Answer:   Tresa Garter [[id number]]  Jesus Hamilton was seen today for hypertension and hyperlipidemia.  Diagnoses and all orders for this visit:  Essential hypertension Blood pressure is well-controlled less than 130/80. Explained that having normal blood pressure is the goal and medications are helping to get to goal and maintain normal blood pressure. DIET: Limit salt intake, read nutrition labels to check salt content, limit fried and high fatty foods  Avoid using multisymptom OTC cold preparations that generally contain sudafed which can rise BP. Consult with pharmacist on best cold relief products to use for persons with HTN EXERCISE Discussed incorporating exercise such as walking - 30 minutes most days of the week and can do in 10 minute intervals    -     lisinopril-hydrochlorothiazide (ZESTORETIC) 10-12.5 MG tablet; Take 1 tablet by mouth  daily.  Mixed hyperlipidemia Increased risk for stroke, heart attack, healthy lifestyle diet of fruits vegetables fish nuts whole grains and low saturated fat . Foods high in cholesterol or liver, fatty meats,cheese, butter avocados, nuts and seeds, chocolate and fried foods.  -     Lipid panel  Class 2 severe obesity due to excess calories with serious comorbidity in adult, unspecified BMI ([place]) Obesity is 30-39 indicating an excess in caloric intake or underlining conditions. This may lead to other co-morbidities. Educated on lifestyle modifications of diet and exercise which may reduce obesity.    Colon cancer screening -     Fecal occult blood, imunochemical  Encounter for HCV screening test for low risk patient -     HCV Ab w Reflex to Quant PCR  Encounter for screening for HIV -     HIV Antibody (routine testing w rflx)       This note has been created with [employer]  Geophysicist/field seismologist. Any transcriptional errors are unintentional.   Kerin Perna, NP 07/01/2022, 9:25 AM

## 2022-07-02 LAB — LIPID PANEL
Chol/HDL Ratio: 2.8 ratio (ref 0.0–5.0)
Cholesterol, Total: 158 mg/dL (ref 100–199)
HDL: 56 mg/dL (ref >39)
LDL Chol Calc (NIH): 92 mg/dL (ref 0–99)
Triglycerides: 47 mg/dL (ref 0–149)
VLDL Cholesterol Cal: 10 mg/dL (ref 5–40)

## 2022-07-04 ENCOUNTER — Other Ambulatory Visit (INDEPENDENT_AMBULATORY_CARE_PROVIDER_SITE_OTHER): Payer: Self-pay | Admitting: Primary Care

## 2022-07-04 MED ORDER — ATORVASTATIN CALCIUM 10 MG PO TABS: 10.0000 mg | ORAL_TABLET | Freq: Every day | ORAL | 1 refills | Status: DC

## 2022-07-12 ENCOUNTER — Other Ambulatory Visit (INDEPENDENT_AMBULATORY_CARE_PROVIDER_SITE_OTHER): Payer: Self-pay | Admitting: Primary Care

## 2022-09-30 ENCOUNTER — Encounter: Payer: Self-pay | Admitting: Gastroenterology

## 2022-11-11 ENCOUNTER — Ambulatory Visit (AMBULATORY_SURGERY_CENTER): Payer: Self-pay | Admitting: *Deleted

## 2022-11-11 VITALS — Ht 72.0 in | Wt 235.0 lb

## 2022-11-11 DIAGNOSIS — Z1211 Encounter for screening for malignant neoplasm of colon: Secondary | ICD-10-CM

## 2022-11-11 MED ORDER — NA SULFATE-K SULFATE-MG SULF 17.5-3.13-1.6 GM/177ML PO SOLN
1.0000 | Freq: Once | ORAL | 0 refills | Status: AC
Start: 2022-11-11 — End: 2022-11-11

## 2022-11-11 NOTE — Progress Notes (Signed)
Pt's name and DOB verified at the beginning of the pre-visit.  Pt denies any difficulty with ambulating.  No egg or soy allergy known to patient  No issues known to pt with past sedation with any surgeries or procedures Pt denies having issues being intubated Patient denies ever being intubated Pt has no issues moving head neck or swallowing No FH of Malignant Hyperthermia Pt is not on diet pills Pt is not on home 02  Pt is not on blood thinners  Pt denies issues with constipation  Pt is not on dialysis Pt denies any upcoming cardiac testing Pt encouraged to use to use Singlecare or Goodrx to reduce cost  Patient's chart reviewed by Jesus Hamilton CNRA prior to pre-visit and patient appropriate for the [facility].  Pre-visit completed and red dot placed by patient's name on their procedure day (on provider's schedule).  . Visit by phone Pt states weight is 235 lb Instructed pt why it is important to and  to call if they have any changes in health or new medications. Directed them to the # given and on instructions.   Pt states they will.  Instructions reviewed with pt and pt states understanding. Instructed to review again prior to procedure. Pt states they will.  Instructions sent by mail with coupon

## 2022-11-17 ENCOUNTER — Encounter: Payer: Self-pay | Admitting: Gastroenterology

## 2022-11-25 ENCOUNTER — Encounter: Payer: Self-pay | Admitting: Gastroenterology

## 2022-11-25 ENCOUNTER — Ambulatory Visit: Payer: Commercial Managed Care - HMO | Admitting: Gastroenterology

## 2022-11-25 ENCOUNTER — Other Ambulatory Visit: Payer: Self-pay

## 2022-11-25 ENCOUNTER — Telehealth: Payer: Self-pay

## 2022-11-25 VITALS — BP 111/68 | HR 80 | Temp 97.3°F | Resp 16 | Ht 72.0 in | Wt 235.0 lb

## 2022-11-25 DIAGNOSIS — Z1211 Encounter for screening for malignant neoplasm of colon: Secondary | ICD-10-CM

## 2022-11-25 DIAGNOSIS — D123 Benign neoplasm of transverse colon: Secondary | ICD-10-CM | POA: Diagnosis not present

## 2022-11-25 DIAGNOSIS — K862 Cyst of pancreas: Secondary | ICD-10-CM

## 2022-11-25 MED ORDER — SODIUM CHLORIDE 0.9 % IV SOLN: 500.0000 mL | Freq: Once | INTRAVENOUS | Status: DC

## 2022-11-25 NOTE — Progress Notes (Signed)
Called to room to assist during endoscopic procedure.  Patient ID and intended procedure confirmed with present staff. Received instructions for my participation in the procedure from the performing physician.  

## 2022-11-25 NOTE — Progress Notes (Signed)
A and O x3. Report to RN. Tolerated MAC anesthesia well. 

## 2022-11-25 NOTE — Telephone Encounter (Signed)
Order and reminder in epic. Rad scheduling to contact pt regarding appt.

## 2022-11-25 NOTE — Progress Notes (Signed)
VS completed by AS. ? ?Pt's states no medical or surgical changes since previsit or office visit. ? ?

## 2022-11-25 NOTE — Progress Notes (Signed)
[facility] History and Physical   Primary Care Physician:  Erlinda Hong, MD   Reason for Procedure:   Colon cancer screening  Plan:    Screening colonoscopy     HPI: Jesus Hamilton is a 52 y.o. male undergoing initial average risk screening colonoscopy.  He has no family history of colon cancer and no chronic GI symptoms.  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   Past Medical History:  Diagnosis Date   COVID-19 03/15/2021   GERD (gastroesophageal reflux disease)    Hyperlipidemia    Hypertension    Prostate cancer [facility])     Past Surgical History:  Procedure Laterality Date   HAND SURGERY Right    PELVIC LYMPH NODE DISSECTION Bilateral 04/21/2021   Procedure: BILATERAL PELVIC LYMPH NODE DISSECTION;  Surgeon: Crist Fat, MD;  [facility]: [facility];  Service: Urology;  Laterality: Bilateral;   PROSTATE BIOPSY     ROBOT ASSISTED LAPAROSCOPIC RADICAL PROSTATECTOMY N/A 04/21/2021   Procedure: XI ROBOTIC ASSISTED LAPAROSCOPIC RADICAL PROSTATECTOMY;  Surgeon: Crist Fat, MD;  Location: [facility];  Service: Urology;  Laterality: N/A;    Prior to Admission medications   Medication Sig Start Date End Date Taking? Authorizing Provider  atorvastatin (LIPITOR) 10 MG tablet Take 1 tablet (10 mg total) by mouth daily. 07/04/22  Yes [name], NP  lisinopril-hydrochlorothiazide (ZESTORETIC) 10-12.5 MG tablet Take 1 tablet by mouth daily. 07/01/22  Yes [name], NP  Cetirizine HCl (ZYRTEC ALLERGY) 10 MG CAPS Take by oral route.    [provider]    Current Outpatient Medications  Medication Sig Dispense Refill   atorvastatin (LIPITOR) 10 MG tablet Take 1 tablet (10 mg total) by mouth daily. 90 tablet 1   lisinopril-hydrochlorothiazide (ZESTORETIC) 10-12.5 MG tablet Take 1 tablet by mouth daily. 90 tablet 1   Cetirizine HCl (ZYRTEC ALLERGY) 10 MG CAPS Take by oral  route.     Current Facility-Administered Medications  Medication Dose Route Frequency Provider Last Rate Last Admin   0.9 %  sodium chloride infusion  500 mL Intravenous Once Jenel Lucks, MD        Allergies as of 11/25/2022   (No Known Allergies)    Family History  Problem Relation Age of Onset   Colon cancer Neg Hx    Colon polyps Neg Hx    Esophageal cancer Neg Hx    Prostate cancer Neg Hx    Rectal cancer Neg Hx     Social History   Socioeconomic History   Marital status: Divorced    Spouse name: Not on file   Number of children: Not on file   Years of education: Not on file   Highest education level: Not on file  Occupational History   Not on file  Tobacco Use   Smoking status: Never   Smokeless tobacco: Never  Vaping Use   Vaping Use: Never used  Substance and Sexual Activity   Alcohol use: Yes    Comment: Once a month   Drug use: Never   Sexual activity: Not on file  Other Topics Concern   Not on file  Social History Narrative   Not on file   Social Determinants of Health   Financial Resource Strain: Not on file  Food Insecurity: Not on file  Transportation Needs: Not on file  Physical Activity: Not on file  Stress: Not on file  Social  Connections: Not on file  Intimate Partner Violence: Not on file    Review of Systems:  All other review of systems negative except as mentioned in the HPI.  Physical Exam: Vital signs BP 118/73   Pulse 70   Temp (!) 97.3 F (36.3 C) (Temporal)   Ht 6' (1.829 m)   Wt 235 lb (106.6 kg)   SpO2 98%   BMI 31.87 kg/m   General:   Alert,  Well-developed, well-nourished, pleasant and cooperative in NAD Airway:  Mallampati 2 Lungs:  Clear throughout to auscultation.   Heart:  Regular rate and rhythm; no murmurs, clicks, rubs,  or gallops. Abdomen:  Soft, nontender and nondistended. Normal bowel sounds.   Neuro/Psych:  Normal mood and affect. A and O x 3   Jaedah Lords E. Tomasa Rand, MD [facility]  Gastroenterology

## 2022-11-25 NOTE — Op Note (Signed)
[facility] Patient Name: Jesus Hamilton Procedure Date: 11/25/2022 10:44 AM MRN: [id number] Endoscopist: Lorin Picket E. Tomasa Rand , MD, [id number] Age: 52 Referring MD:  Date of Birth: 08/24/1970 Gender: Male Account #: [id number] Procedure:                Colonoscopy Indications:              Screening for colorectal malignant neoplasm, This                            is the patient's first colonoscopy Medicines:                Monitored Anesthesia Care Procedure:                Pre-Anesthesia Assessment:                           - Prior to the procedure, a History and Physical                            was performed, and patient medications and                            allergies were reviewed. The patient's tolerance of                            previous anesthesia was also reviewed. The risks                            and benefits of the procedure and the sedation                            options and risks were discussed with the patient.                            All questions were answered, and informed consent                            was obtained. Prior Anticoagulants: The patient has                            taken no anticoagulant or antiplatelet agents. ASA                            Grade Assessment: II - A patient with mild systemic                            disease. After reviewing the risks and benefits,                            the patient was deemed in satisfactory condition to                            undergo the procedure.  After obtaining informed consent, the colonoscope                            was passed under direct vision. Throughout the                            procedure, the patient's blood pressure, pulse, and                            oxygen saturations were monitored continuously. The                            Olympus CF-HQ190L SN [id number] was introduced through                            the anus and  advanced to the the terminal ileum,                            with identification of the appendiceal orifice and                            IC valve. The colonoscopy was performed without                            difficulty. The patient tolerated the procedure                            well. The quality of the bowel preparation was                            good. The terminal ileum, ileocecal valve,                            appendiceal orifice, and rectum were photographed.                            The bowel preparation used was SUPREP via split                            dose instruction. Scope In: 11:00:09 AM Scope Out: 11:15:54 AM Scope Withdrawal Time: 0 hours 12 minutes 6 seconds  Total Procedure Duration: 0 hours 15 minutes 45 seconds  Findings:                 The perianal and digital rectal examinations were                            normal. Pertinent negatives include normal                            sphincter tone and no palpable rectal lesions.                           A 3 mm polyp was found in the distal transverse  colon. The polyp was flat. The polyp was removed                            with a cold snare. Resection and retrieval were                            complete. Estimated blood loss was minimal.                           A few small-mouthed diverticula were found in the                            sigmoid colon and descending colon.                           The exam was otherwise normal throughout the                            examined colon.                           The terminal ileum appeared normal.                           The retroflexed view of the distal rectum and anal                            verge was normal and showed no anal or rectal                            abnormalities. Complications:            No immediate complications. Estimated Blood Loss:     Estimated blood loss was minimal. Impression:                - One 3 mm polyp in the distal transverse colon,                            removed with a cold snare. Resected and retrieved.                           - Mild diverticulosis in the sigmoid colon and in                            the descending colon.                           - The examined portion of the ileum was normal.                           - The distal rectum and anal verge are normal on                            retroflexion view. Recommendation:           -  Patient has a contact number available for                            emergencies. The signs and symptoms of potential                            delayed complications were discussed with the                            patient. Return to normal activities tomorrow.                            Written discharge instructions were provided to the                            patient.                           - Resume previous diet.                           - Continue present medications.                           - Await pathology results.                           - Repeat colonoscopy (date not yet determined) for                            surveillance based on pathology results.                           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 Dashiell Franchino E. Tomasa Rand, MD 11/25/2022 11:21:25 AM This report has been signed electronically.

## 2022-11-25 NOTE — Telephone Encounter (Signed)
-----   Message from Jenel Lucks, MD sent at 11/25/2022 12:00 PM EDT ----- Regarding: MRI pancreas Bonita Quin, Can you please order an MRI pancreas follow up of a pancreas cyst seen in 2022?  I think they usually want these w/ and w/out contrast Thanks

## 2022-11-25 NOTE — Patient Instructions (Signed)
-  Handout on polyps and diverticulosis provided -await pathology results -repeat colonoscopy for surveillance recommended. 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  YOU HAD AN ENDOSCOPIC PROCEDURE TODAY AT THE [facility]:   Refer to the procedure report that was given to you for any specific questions about what was found during the examination.  If the procedure report does not answer your questions, please call your gastroenterologist to clarify.  If you requested that your care partner not be given the details of your procedure findings, then the procedure report has been included in a sealed envelope for you to review at your convenience later.  YOU SHOULD EXPECT: Some feelings of bloating in the abdomen. Passage of more gas than usual.  Walking can help get rid of the air that was put into your GI tract during the procedure and reduce the bloating. If you had a lower endoscopy (such as a colonoscopy or flexible sigmoidoscopy) you may notice spotting of blood in your stool or on the toilet paper. If you underwent a bowel prep for your procedure, you may not have a normal bowel movement for a few days.  Please Note:  You might notice some irritation and congestion in your nose or some drainage.  This is from the oxygen used during your procedure.  There is no need for concern and it should clear up in a day or so.  SYMPTOMS TO REPORT IMMEDIATELY:  Following lower endoscopy (colonoscopy or flexible sigmoidoscopy):  Excessive amounts of blood in the stool  Significant tenderness or worsening of abdominal pains  Swelling of the abdomen that is new, acute  Fever of 100F or higher  For urgent or emergent issues, a gastroenterologist can be reached at any hour by calling [phone]. Do not use MyChart messaging for urgent concerns.    DIET:  We do  recommend a small meal at first, but then you may proceed to your regular diet.  Drink plenty of fluids but you should avoid alcoholic beverages for 24 hours.  ACTIVITY:  You should plan to take it easy for the rest of today and you should NOT DRIVE or use heavy machinery until tomorrow (because of the sedation medicines used during the test).    FOLLOW UP: Our staff will call the number listed on your records the next business day following your procedure.  We will call around 7:15- 8:00 am to check on you and address any questions or concerns that you may have regarding the information given to you following your procedure. If we do not reach you, we will leave a message.     If any biopsies were taken you will be contacted by phone or by letter within the next 1-3 weeks.  Please call us at [phone] if you have not heard about the biopsies in 3 weeks.    SIGNATURES/CONFIDENTIALITY: You and/or your care partner have signed paperwork which will be entered into your electronic medical record.  These signatures attest to the fact that that the information above on your After Visit Summary has been reviewed and is understood.  Full responsibility of the confidentiality of this discharge information lies with you and/or your care-partner.

## 2022-11-28 ENCOUNTER — Telehealth: Payer: Self-pay | Admitting: *Deleted

## 2022-11-28 NOTE — Telephone Encounter (Signed)
  Follow up Call-     11/25/2022   10:23 AM  Call back number  Post procedure Call Back phone  # [phone]  Permission to leave phone message Yes    [facility]

## 2022-12-03 NOTE — Progress Notes (Signed)
Jesus Hamilton, [facility] news: the polyp that I removed during your recent examination was NOT precancerous.  You should continue to follow current colorectal cancer screening guidelines with a repeat colonoscopy in 10 years.    If you develop any new rectal bleeding, abdominal pain or significant bowel habit changes, please contact me before then.

## 2022-12-16 ENCOUNTER — Ambulatory Visit (HOSPITAL_COMMUNITY): Payer: Commercial Managed Care - HMO

## 2022-12-17 ENCOUNTER — Other Ambulatory Visit: Payer: Self-pay | Admitting: Gastroenterology

## 2022-12-17 ENCOUNTER — Ambulatory Visit (HOSPITAL_COMMUNITY)
Admission: RE | Admit: 2022-12-17 | Discharge: 2022-12-17 | Disposition: A | Discharge status: Home / Self Care | Payer: Commercial Managed Care - HMO | Source: Ambulatory Visit | Attending: Gastroenterology | Admitting: Gastroenterology

## 2022-12-17 DIAGNOSIS — K862 Cyst of pancreas: Secondary | ICD-10-CM

## 2022-12-17 MED ORDER — GADOBUTROL 1 MMOL/ML IV SOLN
10.0000 mL | Freq: Once | INTRAVENOUS | Status: AC | PRN
  Administered 2022-12-17: 10 mL via INTRAVENOUS

## 2022-12-20 NOTE — Progress Notes (Signed)
Jesus Hamilton,  The cystic lesion in your pancreas has remained stable in size over the past 2 years.  However, given its size, I would recommend you consider endoscopic ultrasound (EUS) to examine the cyst more closely and obtain fluid for further characterization.  Some cysts, even though benign, have potential to turn into cancer over time and surgery may need to be considered.  I will ask our provider who performs EUS (Dr. Meridee Score) to review your case and see if he agrees that EUS is indicated.  If so, we will contact you and schedule the procedure.

## 2022-12-21 ENCOUNTER — Telehealth: Payer: Self-pay

## 2022-12-21 NOTE — Telephone Encounter (Signed)
-----   Message from Lemar Lofty., MD sent at 12/20/2022  5:24 PM EDT ----- Regarding: RE: EUS for pancreatic tail cyst SEC, Agree this patient needs an EUS for pancreatic cyst evaluation and trying to define exactly what type of cyst this is. Happy to be available for this, if patient is amenable.  Miosha Behe, Please offer this patient an EUS with me next available in the coming weeks of end of July or August. Thanks. GM ----- Message ----- From: Jenel Lucks, MD Sent: 12/20/2022   6:46 AM EDT To: Lemar Lofty., MD Subject: EUS for pancreatic tail cyst                   GM,  Would you please consider performing an EUS w/ FNA on this patient.  52 y/o m with hx of prostate cancer.  Large cyst in TOP (almost 4 cm), stable in size since at least 2022.  I saw him for colon cancer screening.  Thanks,  SEC

## 2022-12-22 ENCOUNTER — Other Ambulatory Visit: Payer: Self-pay

## 2022-12-22 DIAGNOSIS — K862 Cyst of pancreas: Secondary | ICD-10-CM

## 2022-12-22 NOTE — Telephone Encounter (Signed)
EUS scheduled, pt instructed and medications reviewed.  Patient instructions mailed to home.  Patient to call with any questions or concerns.  

## 2022-12-22 NOTE — Telephone Encounter (Signed)
EUS has been scheduled for 03/02/23 at 245 pm at [facility] with GM   Left message on machine to call back

## 2022-12-26 ENCOUNTER — Other Ambulatory Visit (INDEPENDENT_AMBULATORY_CARE_PROVIDER_SITE_OTHER): Payer: Self-pay | Admitting: Primary Care

## 2022-12-30 ENCOUNTER — Encounter (INDEPENDENT_AMBULATORY_CARE_PROVIDER_SITE_OTHER): Payer: Self-pay | Admitting: Primary Care

## 2022-12-30 ENCOUNTER — Ambulatory Visit (INDEPENDENT_AMBULATORY_CARE_PROVIDER_SITE_OTHER): Payer: Commercial Managed Care - HMO | Admitting: Primary Care

## 2022-12-30 VITALS — BP 111/70 | HR 77 | Resp 16 | Wt 235.4 lb

## 2022-12-30 DIAGNOSIS — I10 Essential (primary) hypertension: Secondary | ICD-10-CM

## 2022-12-30 DIAGNOSIS — Z1159 Encounter for screening for other viral diseases: Secondary | ICD-10-CM

## 2022-12-30 DIAGNOSIS — E782 Mixed hyperlipidemia: Secondary | ICD-10-CM

## 2022-12-30 DIAGNOSIS — Z114 Encounter for screening for human immunodeficiency virus [HIV]: Secondary | ICD-10-CM

## 2022-12-30 DIAGNOSIS — C61 Malignant neoplasm of prostate: Secondary | ICD-10-CM | POA: Diagnosis not present

## 2022-12-31 LAB — CMP14+EGFR
ALT: 50 IU/L — ABNORMAL HIGH (ref 0–44)
AST: 21 IU/L (ref 0–40)
Albumin: 4.3 g/dL (ref 3.8–4.9)
Alkaline Phosphatase: 61 IU/L (ref 44–121)
BUN/Creatinine Ratio: 17 (ref 9–20)
BUN: 13 mg/dL (ref 6–24)
Bilirubin Total: 0.6 mg/dL (ref 0.0–1.2)
CO2: 27 mmol/L (ref 20–29)
Calcium: 9.2 mg/dL (ref 8.7–10.2)
Chloride: 102 mmol/L (ref 96–106)
Creatinine, Ser: 0.77 mg/dL (ref 0.76–1.27)
Globulin, Total: 2.5 g/dL (ref 1.5–4.5)
Glucose: 77 mg/dL (ref 70–99)
Potassium: 3.9 mmol/L (ref 3.5–5.2)
Sodium: 142 mmol/L (ref 134–144)
Total Protein: 6.8 g/dL (ref 6.0–8.5)
eGFR: 108 mL/min/{1.73_m2} (ref >59)

## 2022-12-31 LAB — HCV INTERPRETATION

## 2022-12-31 LAB — CBC WITH DIFFERENTIAL/PLATELET
Basophils Absolute: 0 10*3/uL (ref 0.0–0.2)
Basos: 0 %
EOS (ABSOLUTE): 0.3 10*3/uL (ref 0.0–0.4)
Eos: 4 %
Hematocrit: 44.8 % (ref 37.5–51.0)
Hemoglobin: 14.6 g/dL (ref 13.0–17.7)
Immature Grans (Abs): 0 10*3/uL (ref 0.0–0.1)
Immature Granulocytes: 0 %
Lymphocytes Absolute: 1.9 10*3/uL (ref 0.7–3.1)
Lymphs: 25 %
MCH: 28.4 pg (ref 26.6–33.0)
MCHC: 32.6 g/dL (ref 31.5–35.7)
MCV: 87 fL (ref 79–97)
Monocytes Absolute: 0.6 10*3/uL (ref 0.1–0.9)
Monocytes: 9 %
Neutrophils Absolute: 4.6 10*3/uL (ref 1.4–7.0)
Neutrophils: 62 %
Platelets: 284 10*3/uL (ref 150–450)
RBC: 5.14 x10E6/uL (ref 4.14–5.80)
RDW: 13.2 % (ref 11.6–15.4)
WBC: 7.5 10*3/uL (ref 3.4–10.8)

## 2022-12-31 LAB — LIPID PANEL
Chol/HDL Ratio: 3.2 ratio (ref 0.0–5.0)
Cholesterol, Total: 163 mg/dL (ref 100–199)
HDL: 51 mg/dL (ref >39)
LDL Chol Calc (NIH): 101 mg/dL — ABNORMAL HIGH (ref 0–99)
Triglycerides: 54 mg/dL (ref 0–149)
VLDL Cholesterol Cal: 11 mg/dL (ref 5–40)

## 2022-12-31 LAB — PSA: Prostate Specific Ag, Serum: <0.1 ng/mL (ref 0.0–4.0)

## 2022-12-31 LAB — HCV AB W REFLEX TO QUANT PCR: HCV Ab: NONREACTIVE

## 2022-12-31 LAB — HIV ANTIBODY (ROUTINE TESTING W REFLEX): HIV Screen 4th Generation wRfx: NONREACTIVE

## 2023-01-08 ENCOUNTER — Encounter (INDEPENDENT_AMBULATORY_CARE_PROVIDER_SITE_OTHER): Payer: Self-pay | Admitting: Primary Care

## 2023-01-08 NOTE — Progress Notes (Signed)
[facility]  Jesus Hamilton, is a 52 y.o. male  KGM:[id number]  UYQ:[id number]  DOB - Jun 29, 1971  Chief Complaint  Patient presents with   Hypertension       Subjective:   Jesus Hamilton is a 52 y.o. male here today for a follow up visit for HTN. Patient has No headache, No chest pain, No abdominal pain - No Nausea, No new weakness tingling or numbness, No Cough - shortness of breath.   No problems updated.  No Known Allergies  Past Medical History:  Diagnosis Date   COVID-19 03/15/2021   GERD (gastroesophageal reflux disease)    Hyperlipidemia    Hypertension    Prostate cancer (HCC)     Current Outpatient Medications on File Prior to Visit  Medication Sig Dispense Refill   atorvastatin (LIPITOR) 10 MG tablet Take 1 tablet by mouth once daily 90 tablet 0   Cetirizine HCl (ZYRTEC ALLERGY) 10 MG CAPS Take by oral route.     lisinopril-hydrochlorothiazide (ZESTORETIC) 10-12.5 MG tablet Take 1 tablet by mouth daily. 90 tablet 1   No current facility-administered medications on file prior to visit.    Objective:   Vitals:   12/30/22 1047  BP: 111/70  Pulse: 77  Resp: 16  SpO2: 100%  Weight: 235 lb 6.4 oz (106.8 kg)    Comprehensive ROS Pertinent positive and negative noted in HPI   Exam General appearance : Awake, alert, not in any distress. Speech Clear. Not toxic looking HEENT: Atraumatic and Normocephalic, pupils equally reactive to light and accomodation Neck: Supple, no JVD. No cervical lymphadenopathy.  Chest: Good air entry bilaterally, no added sounds  CVS: S1 S2 regular, no murmurs.  Abdomen: Bowel sounds present, Non tender and not distended with no gaurding, rigidity or rebound. Extremities: B/L Lower Ext shows no edema, both legs are warm to touch Neurology: Awake alert, and oriented X 3, CN II-XII intact, Non focal Skin: No Rash  Data Review Lab Results  Component Value Date   HGBA1C 5.8 (A) 12/31/2021    Assessment & Plan    Pheng was seen today for hypertension.  Diagnoses and all orders for this visit:  Essential hypertension Well controlled  - < 130/80 DIET: Limit salt intake, read nutrition labels to check salt content, limit fried and high fatty foods  Avoid using multisymptom OTC cold preparations that generally contain sudafed which can rise BP. Consult with pharmacist on best cold relief products to use for persons with HTN EXERCISE Discussed incorporating exercise such as walking - 30 minutes most days of the week and can do in 10 minute intervals    -     CBC with Differential/Platelet -     CMP14+EGFR  Mixed hyperlipidemia  Healthy lifestyle diet of fruits vegetables fish nuts whole grains and low saturated fat . Foods high in cholesterol or liver, fatty meats,cheese, butter avocados, nuts and seeds, chocolate and fried foods. -     Lipid panel  Prostate cancer (HCC) -     PSA  Patient have been counseled extensively about nutrition and exercise. Other issues discussed during this visit include: low cholesterol diet, weight control and daily exercise, foot care, annual eye examinations at Ophthalmology, importance of adherence with medications and regular follow-up. We also discussed long term complications of uncontrolled diabetes and hypertension.   No follow-ups on file.  The patient was given clear instructions to go to ER or return to medical center if symptoms don't improve, worsen or new problems develop.  The patient verbalized understanding. The patient was told to call to get lab results if they haven't heard anything in the next week.   This note has been created with Education officer, environmental. Any transcriptional errors are unintentional.   [name], NP 01/08/2023, 10:01 PM

## 2023-02-04 ENCOUNTER — Other Ambulatory Visit (INDEPENDENT_AMBULATORY_CARE_PROVIDER_SITE_OTHER): Payer: Self-pay | Admitting: Primary Care

## 2023-02-04 DIAGNOSIS — I10 Essential (primary) hypertension: Secondary | ICD-10-CM

## 2023-02-06 ENCOUNTER — Telehealth: Payer: Self-pay | Admitting: Gastroenterology

## 2023-02-06 NOTE — Telephone Encounter (Signed)
PT is calling to reschedule EUS. Please advise.

## 2023-02-07 NOTE — Telephone Encounter (Signed)
The pt wanted to reschedule the EUS sooner on a Friday.  I have advised that no appts on Fridays.  He states he will keep the appt as planned.

## 2023-02-07 NOTE — Telephone Encounter (Signed)
Left message on machine to call back  

## 2023-02-23 ENCOUNTER — Encounter (HOSPITAL_COMMUNITY): Payer: Self-pay | Admitting: Gastroenterology

## 2023-03-02 ENCOUNTER — Encounter (HOSPITAL_COMMUNITY)
Admission: RE | Disposition: A | Discharge status: Home / Self Care | Payer: Self-pay | Source: Home / Self Care | Attending: Gastroenterology

## 2023-03-02 ENCOUNTER — Ambulatory Visit (HOSPITAL_COMMUNITY): Payer: Commercial Managed Care - HMO | Admitting: Anesthesiology

## 2023-03-02 ENCOUNTER — Encounter (HOSPITAL_COMMUNITY): Payer: Self-pay | Admitting: Gastroenterology

## 2023-03-02 ENCOUNTER — Ambulatory Visit (HOSPITAL_COMMUNITY)
Admission: RE | Admit: 2023-03-02 | Discharge: 2023-03-02 | Disposition: A | Discharge status: Home / Self Care | Payer: Commercial Managed Care - HMO | Attending: Gastroenterology | Admitting: Gastroenterology

## 2023-03-02 ENCOUNTER — Other Ambulatory Visit: Payer: Self-pay

## 2023-03-02 ENCOUNTER — Ambulatory Visit (HOSPITAL_BASED_OUTPATIENT_CLINIC_OR_DEPARTMENT_OTHER): Payer: Commercial Managed Care - HMO | Admitting: Anesthesiology

## 2023-03-02 DIAGNOSIS — K862 Cyst of pancreas: Secondary | ICD-10-CM

## 2023-03-02 DIAGNOSIS — I10 Essential (primary) hypertension: Secondary | ICD-10-CM | POA: Diagnosis not present

## 2023-03-02 DIAGNOSIS — K2289 Other specified disease of esophagus: Secondary | ICD-10-CM

## 2023-03-02 DIAGNOSIS — Z79899 Other long term (current) drug therapy: Secondary | ICD-10-CM | POA: Insufficient documentation

## 2023-03-02 DIAGNOSIS — K3189 Other diseases of stomach and duodenum: Secondary | ICD-10-CM | POA: Diagnosis not present

## 2023-03-02 DIAGNOSIS — I899 Noninfective disorder of lymphatic vessels and lymph nodes, unspecified: Secondary | ICD-10-CM

## 2023-03-02 HISTORY — PX: ESOPHAGOGASTRODUODENOSCOPY: SHX5428

## 2023-03-02 HISTORY — PX: EUS: SHX5427

## 2023-03-02 HISTORY — PX: FINE NEEDLE ASPIRATION: SHX5430

## 2023-03-02 HISTORY — PX: BIOPSY: SHX5522

## 2023-03-02 SURGERY — UPPER ENDOSCOPIC ULTRASOUND (EUS) RADIAL
Anesthesia: Monitor Anesthesia Care

## 2023-03-02 MED ORDER — PROPOFOL 500 MG/50ML IV EMUL
INTRAVENOUS | Status: DC | PRN
  Administered 2023-03-02: 120 ug/kg/min via INTRAVENOUS

## 2023-03-02 MED ORDER — ONDANSETRON HCL 4 MG/2ML IJ SOLN
INTRAMUSCULAR | Status: DC | PRN
  Administered 2023-03-02: 4 mg via INTRAVENOUS

## 2023-03-02 MED ORDER — PROPOFOL 500 MG/50ML IV EMUL
INTRAVENOUS | Status: AC
  Filled 2023-03-02: qty 50

## 2023-03-02 MED ORDER — CIPROFLOXACIN IN D5W 400 MG/200ML IV SOLN
INTRAVENOUS | Status: DC | PRN
  Administered 2023-03-02: 400 mg via INTRAVENOUS

## 2023-03-02 MED ORDER — CIPROFLOXACIN IN D5W 400 MG/200ML IV SOLN
INTRAVENOUS | Status: AC
  Filled 2023-03-02: qty 200

## 2023-03-02 MED ORDER — PROPOFOL 1000 MG/100ML IV EMUL
INTRAVENOUS | Status: AC
  Filled 2023-03-02: qty 100

## 2023-03-02 MED ORDER — CIPROFLOXACIN HCL 500 MG PO TABS: 500.0000 mg | ORAL_TABLET | Freq: Two times a day (BID) | ORAL | 0 refills | Status: AC

## 2023-03-02 MED ORDER — LACTATED RINGERS IV SOLN: INTRAVENOUS | Status: DC

## 2023-03-02 MED ORDER — DEXMEDETOMIDINE HCL IN NACL 80 MCG/20ML IV SOLN
INTRAVENOUS | Status: DC | PRN
  Administered 2023-03-02: 8 ug via INTRAVENOUS

## 2023-03-02 MED ORDER — PROPOFOL 10 MG/ML IV BOLUS
INTRAVENOUS | Status: DC | PRN
  Administered 2023-03-02: 10 mg via INTRAVENOUS
  Administered 2023-03-02: 40 mg via INTRAVENOUS
  Administered 2023-03-02: 10 mg via INTRAVENOUS
  Administered 2023-03-02: 20 mg via INTRAVENOUS

## 2023-03-02 MED ORDER — LIDOCAINE HCL 1 % IJ SOLN
INTRAMUSCULAR | Status: DC | PRN
  Administered 2023-03-02 (×2): 50 mg via INTRADERMAL

## 2023-03-02 NOTE — Op Note (Signed)
[facility] Patient Name: Jesus Hamilton Procedure Date: 03/02/2023 MRN: [id number] Attending MD: Corliss Parish , MD, [id number] Date of Birth: 06/13/1971 CSN: [id number] Age: 52 Admit Type: Outpatient Procedure:                Upper EUS Indications:              Pancreatic cyst on MRCP Providers:                Corliss Parish, MD, Doristine Mango, RN, Marja Kays, Technician Referring MD:             Dub Amis. Tomasa Rand, MD Medicines:                Monitored Anesthesia Care, Cipro 400 mg IV Complications:            No immediate complications. Estimated Blood Loss:     Estimated blood loss was minimal. Procedure:                Pre-Anesthesia Assessment:                           - Prior to the procedure, a History and Physical                            was performed, and patient medications and                            allergies were reviewed. The patient's tolerance of                            previous anesthesia was also reviewed. The risks                            and benefits of the procedure and the sedation                            options and risks were discussed with the patient.                            All questions were answered, and informed consent                            was obtained. Prior Anticoagulants: The patient has                            taken no anticoagulant or antiplatelet agents. ASA                            Grade Assessment: II - A patient with mild systemic                            disease. After reviewing the risks and benefits,  the patient was deemed in satisfactory condition to                            undergo the procedure.                           After obtaining informed consent, the endoscope was                            passed under direct vision. Throughout the                            procedure, the patient's blood pressure, pulse, and                             oxygen saturations were monitored continuously. The                            [id number]-[id number] ([id number]) Olympus endoscope was introduced                            through the mouth, and advanced to the second part                            of duodenum. The [id number]-[id number] ([id number]) Olympus                            duodenoscope was introduced through the mouth, and                            advanced to the area of papilla. The [id number]                            ([id number]) Olympus linear ultrasound scope was                            introduced through the mouth, and advanced to the                            duodenum for ultrasound examination from the                            stomach and duodenum. The upper EUS was                            accomplished without difficulty. The patient                            tolerated the procedure. Scope In: Scope Out: Findings:      ENDOSCOPIC FINDING: :      No gross lesions were noted in the entire esophagus.      The Z-line was irregular and was found 41 cm from the incisors.      Patchy mildly erythematous mucosa without bleeding was found in the  entire examined stomach. Biopsies were taken with a cold forceps for       histology and Helicobacter pylori testing.      No gross lesions were noted in the duodenal bulb, in the first portion       of the duodenum and in the second portion of the duodenum.      The major papilla was normal.      ENDOSONOGRAPHIC FINDING: :      An anechoic lesion suggestive of a cyst was identified in the pancreatic       tail. It is not in obvious communication with the pancreatic duct. The       lesion measured 36 mm by 30 mm in maximal cross-sectional diameter.       There were a few compartments thinly septated. The outer wall of the       lesion was thin. There was no associated mass. There was no internal       debris within the fluid-filled cavity. Diagnostic needle aspiration  for       fluid was performed. Color Doppler imaging was utilized prior to needle       puncture to confirm a lack of significant vascular structures within the       needle path. One pass was made with the 22 gauge needle using a       transgastric approach. A stylet was used. The amount of fluid collected       was 12 mL. The fluid was clear, yellow and watery. Sample(s) were sent       for glucose, amylase concentration, cytology and CEA.      Pancreatic parenchymal abnormalities were noted in the pancreatic head       (PD = 2.6 mm), genu of the pancreas (PD = 0.7 mm), pancreatic body (PD =       0.4 mm) and pancreatic tail PD = 0.5 mm). These consisted of lobularity       without honeycombing and hyperechoic strands.      There was no sign of significant endosonographic abnormality in the       common bile duct and in the common hepatic duct. An unremarkable       gallbladder and ducts with regular contour were identified.      Endosonographic imaging of the ampulla showed no intramural       (subepithelial) lesion.      Endosonographic imaging in the visualized portion of the liver showed no       mass.      No malignant-appearing lymph nodes were visualized in the celiac region       (level 20), peripancreatic region and porta hepatis region.      The celiac region was visualized. Impression:               EGD impression:                           - No gross lesions in the entire esophagus. Z-line                            irregular, 41 cm from the incisors.                           - Erythematous mucosa in the stomach. Biopsied.                           -  No gross lesions in the duodenal bulb, in the                            first portion of the duodenum and in the second                            portion of the duodenum.                           - Normal major papilla.                           EUS impression:                           - A cystic lesion was seen in the  pancreatic tail.                            Cytology results are pending. Clear evaluation of                            the pancreatic duct is not noted. Etiology of the                            cyst is not clear. Fine needle aspiration for fluid                            performed.                           - Pancreatic parenchymal abnormalities consisting                            of lobularity and hyperechoic strands were noted in                            the pancreatic head, genu of the pancreas,                            pancreatic body and pancreatic tail.                           - There was no sign of significant pathology in the                            common bile duct and in the common hepatic duct.                           - No malignant-appearing lymph nodes were                            visualized in the celiac region (level 20),  peripancreatic region and porta hepatis region. Moderate Sedation:      Not Applicable - Patient had care per Anesthesia. Recommendation:           - The patient will be observed post-procedure,                            until all discharge criteria are met.                           - Discharge patient to home.                           - Patient has a contact number available for                            emergencies. The signs and symptoms of potential                            delayed complications were discussed with the                            patient. Return to normal activities tomorrow.                            Written discharge instructions were provided to the                            patient.                           - Low fat diet for 1 week.                           - Please use Cepacol or Halls Lozenges +/-                            Chloraseptic spray for next 72-96 hours to aid in                            sore thoat should you experience this.                           -  Ciprofloxacin 500 mg twice daily x 3 days to                            decrease post interventional infectious risk.                           - Continue present medications otherwise.                           - Observe patient's clinical course.                           - Monitor for signs/symptoms of bleeding,  perforation, and infection. If issues please call                            our number to get further assistance as needed.                           -Follow-up pathology and cytology.                           -Pending results, will discuss role of surveillance                            imaging and timing.                           - The findings and recommendations were discussed                            with the patient.                           - The findings and recommendations were discussed                            with the designated responsible adult. Procedure Code(s):        --- Professional ---                           [phone], Esophagogastroduodenoscopy, flexible,                            transoral; with transendoscopic ultrasound-guided                            intramural or transmural fine needle                            aspiration/biopsy(s), (includes endoscopic                            ultrasound examination limited to the esophagus,                            stomach or duodenum, and adjacent structures)                           43239, 59, Esophagogastroduodenoscopy, flexible,                            transoral; with biopsy, single or multiple Diagnosis Code(s):        --- Professional ---                           K22.89, Other specified disease of esophagus                           K31.89, Other diseases of  stomach and duodenum                           K86.2, Cyst of pancreas                           K86.9, Disease of pancreas, unspecified                           I89.9, Noninfective disorder of lymphatic  vessels                            and lymph nodes, unspecified CPT copyright 2022 American [employer] Association. All rights reserved. The codes documented in this report are preliminary and upon coder review may  be revised to meet current compliance requirements. Corliss Parish, MD 03/02/2023 3:27:11 PM Number of Addenda: 0

## 2023-03-02 NOTE — Anesthesia Postprocedure Evaluation (Signed)
Anesthesia Post Note  Patient: Jesus Hamilton  Procedure(s) Performed: UPPER ENDOSCOPIC ULTRASOUND (EUS) RADIAL ESOPHAGOGASTRODUODENOSCOPY (EGD) BIOPSY FINE NEEDLE ASPIRATION (FNA) LINEAR     Patient location during evaluation: PACU Anesthesia Type: MAC Level of consciousness: awake and alert and oriented Pain management: pain level controlled Vital Signs Assessment: post-procedure vital signs reviewed and stable Respiratory status: spontaneous breathing, nonlabored ventilation and respiratory function stable Cardiovascular status: stable and blood pressure returned to baseline Postop Assessment: no apparent nausea or vomiting Anesthetic complications: no   No notable events documented.  Last Vitals:  Vitals:   03/02/23 1534 03/02/23 1536  BP: 110/70 110/70  Pulse: 77 78  Resp: (!) 25 19  Temp:    SpO2: 94% 95%    Last Pain:  Vitals:   03/02/23 1534  TempSrc:   PainSc: 0-No pain                 , A.

## 2023-03-02 NOTE — Discharge Instructions (Signed)
YOU HAD AN ENDOSCOPIC PROCEDURE TODAY: Refer to the procedure report and other information in the discharge instructions given to you for any specific questions about what was found during the examination. If this information does not answer your questions, please call [place] office at [phone] to clarify.   YOU SHOULD EXPECT: Some feelings of bloating in the abdomen. Passage of more gas than usual. Walking can help get rid of the air that was put into your GI tract during the procedure and reduce the bloating. If you had a lower endoscopy (such as a colonoscopy or flexible sigmoidoscopy) you may notice spotting of blood in your stool or on the toilet paper. Some abdominal soreness may be present for a day or two, also.  DIET: Your first meal following the procedure should be a light meal and then it is ok to progress to your normal diet. A half-sandwich or bowl of soup is an example of a good first meal. Heavy or fried foods are harder to digest and may make you feel nauseous or bloated. Drink plenty of fluids but you should avoid alcoholic beverages for 24 hours. If you had a esophageal dilation, please see attached instructions for diet.    ACTIVITY: Your care partner should take you home directly after the procedure. You should plan to take it easy, moving slowly for the rest of the day. You can resume normal activity the day after the procedure however YOU SHOULD NOT DRIVE, use power tools, machinery or perform tasks that involve climbing or major physical exertion for 24 hours (because of the sedation medicines used during the test).   SYMPTOMS TO REPORT IMMEDIATELY: A gastroenterologist can be reached at any hour. Please call [phone]  for any of the following symptoms:   Following upper endoscopy (EGD, EUS, ERCP, esophageal dilation) Vomiting of blood or coffee ground material  New, significant abdominal pain  New, significant chest pain or pain under the shoulder blades  Painful or  persistently difficult swallowing  New shortness of breath  Black, tarry-looking or red, bloody stools  FOLLOW UP:  If any biopsies were taken you will be contacted by phone or by letter within the next 1-3 weeks. Call [phone]  if you have not heard about the biopsies in 3 weeks.  Please also call with any specific questions about appointments or follow up tests.  

## 2023-03-02 NOTE — Anesthesia Preprocedure Evaluation (Addendum)
Anesthesia Evaluation  Patient identified by MRN, date of birth, ID band Patient awake    Reviewed: Allergy & Precautions, NPO status , Patient's Chart, lab work & pertinent test results  Airway Mallampati: III  TM Distance: >3 FB Neck ROM: Full    Dental no notable dental hx.    Pulmonary neg pulmonary ROS   Pulmonary exam normal        Cardiovascular hypertension, Pt. on medications Normal cardiovascular exam     Neuro/Psych negative neurological ROS  negative psych ROS   GI/Hepatic negative GI ROS, Neg liver ROS,,,  Endo/Other  negative endocrine ROS    Renal/GU negative Renal ROS     Musculoskeletal negative musculoskeletal ROS (+)    Abdominal  (+) + obese  Peds  Hematology negative hematology ROS (+)   Anesthesia Other Findings pancreatic cyst  Reproductive/Obstetrics                             Anesthesia Physical Anesthesia Plan  ASA: 2  Anesthesia Plan: MAC   Post-op Pain Management:    Induction: Intravenous  PONV Risk Score and Plan: 1 and Propofol infusion and Treatment may vary due to age or medical condition  Airway Management Planned: Nasal Cannula  Additional Equipment:   Intra-op Plan:   Post-operative Plan:   Informed Consent: I have reviewed the patients History and Physical, chart, labs and discussed the procedure including the risks, benefits and alternatives for the proposed anesthesia with the patient or authorized representative who has indicated his/her understanding and acceptance.     Dental advisory given  Plan Discussed with: CRNA  Anesthesia Plan Comments:        Anesthesia Quick Evaluation

## 2023-03-02 NOTE — Transfer of Care (Signed)
Immediate Anesthesia Transfer of Care Note  Patient: Jesus Hamilton  Procedure(s) Performed: UPPER ENDOSCOPIC ULTRASOUND (EUS) RADIAL ESOPHAGOGASTRODUODENOSCOPY (EGD) BIOPSY FINE NEEDLE ASPIRATION (FNA) LINEAR  Patient Location: PACU  Anesthesia Type:MAC  Level of Consciousness: awake and alert   Airway & Oxygen Therapy: Patient Spontanous Breathing and Patient connected to face mask oxygen  Post-op Assessment: Report given to RN and Post -op Vital signs reviewed and stable  Post vital signs: Reviewed and stable  Last Vitals:  Vitals Value Taken Time  BP 125/74 03/02/23 1526  Temp 36.3 C 03/02/23 1526  Pulse 83 03/02/23 1528  Resp 26 03/02/23 1528  SpO2 94 % 03/02/23 1528  Vitals shown include unfiled device data.  Last Pain:  Vitals:   03/02/23 1526  TempSrc: Temporal  PainSc: 0-No pain         Complications: No notable events documented.

## 2023-03-02 NOTE — H&P (Signed)
GASTROENTEROLOGY PROCEDURE H&P NOTE   Primary Care Physician: Patient, No Pcp Per  HPI: AASIN DENHART is a 52 y.o. male who presents for EGD/EUS to evaluate pancreatic cyst greater than 3 cm.  Past Medical History:  Diagnosis Date   COVID-19 03/15/2021   GERD (gastroesophageal reflux disease)    Hyperlipidemia    Hypertension    Prostate cancer [facility])    Past Surgical History:  Procedure Laterality Date   HAND SURGERY Right    PELVIC LYMPH NODE DISSECTION Bilateral 04/21/2021   Procedure: BILATERAL PELVIC LYMPH NODE DISSECTION;  Surgeon: Crist Fat, MD;  Location: [facility];  Service: Urology;  Laterality: Bilateral;   PROSTATE BIOPSY     ROBOT ASSISTED LAPAROSCOPIC RADICAL PROSTATECTOMY N/A 04/21/2021   Procedure: XI ROBOTIC ASSISTED LAPAROSCOPIC RADICAL PROSTATECTOMY;  Surgeon: Crist Fat, MD;  Location: [facility];  Service: Urology;  Laterality: N/A;   Current Facility-Administered Medications  Medication Dose Route Frequency Provider Last Rate Last Admin   lactated ringers infusion   Intravenous Continuous Mansouraty, Netty Starring., MD 10 mL/hr at 03/02/23 1325 New Bag at 03/02/23 1325   lactated ringers infusion   Intravenous Continuous Mansouraty, Netty Starring., MD        Current Facility-Administered Medications:    lactated ringers infusion, , Intravenous, Continuous, Mansouraty, Netty Starring., MD, Last Rate: 10 mL/hr at 03/02/23 1325, New Bag at 03/02/23 1325   lactated ringers infusion, , Intravenous, Continuous, Mansouraty, Netty Starring., MD No Known Allergies Family History  Problem Relation Age of Onset   Colon cancer Neg Hx    Colon polyps Neg Hx    Esophageal cancer Neg Hx    Prostate cancer Neg Hx    Rectal cancer Neg Hx    Social History   Socioeconomic History   Marital status: Divorced    Spouse name: Not on file   Number of children: Not on file   Years of education: Not on file   Highest education level: Not on file  Occupational History    Not on file  Tobacco Use   Smoking status: Never   Smokeless tobacco: Never  Vaping Use   Vaping status: Never Used  Substance and Sexual Activity   Alcohol use: Yes    Comment: Once a month   Drug use: Never   Sexual activity: Not on file  Other Topics Concern   Not on file  Social History Narrative   Not on file   Social Determinants of Health   Financial Resource Strain: Not on file  Food Insecurity: Not on file  Transportation Needs: Not on file  Physical Activity: Not on file  Stress: Not on file  Social Connections: Not on file  Intimate Partner Violence: Not on file    Physical Exam: Today's Vitals   02/23/23 1147 03/02/23 1305  BP:  124/71  Pulse:  70  Resp:  18  Temp:  (!) 96.8 F (36 C)  TempSrc:  Temporal  SpO2:  99%  Weight: 106.1 kg 106.1 kg  Height:  6' (1.829 m)  PainSc:  0-No pain   Body mass index is 31.72 kg/m. GEN: NAD EYE: Sclerae anicteric ENT: MMM CV: Non-tachycardic GI: Soft, NT/ND NEURO:  Alert & Oriented x 3  Lab Results: No results for input(s): "WBC", "HGB", "HCT", "PLT" in the last 72 hours. BMET No results for input(s): "NA", "K", "CL", "CO2", "GLUCOSE", "BUN", "CREATININE", "CALCIUM" in the last 72 hours. LFT No results for input(s): "PROT", "ALBUMIN", "AST", "ALT", "  ALKPHOS", "BILITOT", "BILIDIR", "IBILI" in the last 72 hours. PT/INR No results for input(s): "LABPROT", "INR" in the last 72 hours.   Impression / Plan: This is a 52 y.o.male who presents for EGD/EUS to evaluate pancreatic cyst greater than 3 cm.  The risks of an EUS including intestinal perforation, bleeding, infection, aspiration, and medication effects were discussed as was the possibility it may not give a definitive diagnosis if a biopsy is performed.  When a biopsy of the pancreas is done as part of the EUS, there is an additional risk of pancreatitis at the rate of about 1-2%.  It was explained that procedure related pancreatitis is typically mild,  although it can be severe and even life threatening, which is why we do not perform random pancreatic biopsies and only biopsy a lesion/area we feel is concerning enough to warrant the risk.  The risks and benefits of endoscopic evaluation/treatment were discussed with the patient and/or family; these include but are not limited to the risk of perforation, infection, bleeding, missed lesions, lack of diagnosis, severe illness requiring hospitalization, as well as anesthesia and sedation related illnesses.  The patient's history has been reviewed, patient examined, no change in status, and deemed stable for procedure.  The patient and/or family is agreeable to proceed.    Corliss Parish, MD [facility] Office # [phone]

## 2023-03-03 LAB — SURGICAL PATHOLOGY

## 2023-03-04 ENCOUNTER — Encounter: Payer: Self-pay | Admitting: Gastroenterology

## 2023-03-06 ENCOUNTER — Encounter (HOSPITAL_COMMUNITY): Payer: Self-pay | Admitting: Gastroenterology

## 2023-03-06 LAB — CYTOLOGY - NON PAP

## 2023-03-14 ENCOUNTER — Encounter: Payer: Self-pay | Admitting: Gastroenterology

## 2023-03-14 NOTE — Progress Notes (Signed)
[facility] pancreatic fluid analysis  Pancreatic tail cyst fluid Diagnosis atypical with abundant extracellular mucin without epithelial elements. 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 Additional PancraGEN molecular testing is pending at this time.  As such, it makes sense for patient to have discussion with a surgeon as to whether surgical resection would be offered versus continued high risk surveillance.  His case will be discussed at a multidisciplinary conference, and a referral will be placed to one of our pancreaticobiliary surgeons.  I will forward these results to the patient's primary gastroenterologist will also make a copy of his fluid analysis and send that to him for his records.   Corliss Parish, MD [facility] Office # [phone]

## 2023-03-17 NOTE — Progress Notes (Signed)
Referral has been made and records faxed to [facility].

## 2023-03-25 ENCOUNTER — Other Ambulatory Visit (INDEPENDENT_AMBULATORY_CARE_PROVIDER_SITE_OTHER): Payer: Self-pay | Admitting: Primary Care

## 2023-03-26 NOTE — Progress Notes (Signed)
Additional PancraGEN analysis has returned  DNA quantity = elevated quantity DNA quality = good-quality GNAS equal no mutation detected KRAS = no mutation detected Tumor suppressor genes = no LOH detected  Overall this is a statistically higher risk cyst.  There is a 65% probability of high-grade dysplasia/carcinoma development in the future based on the PancraGEN analysis.  His case will still be discussed at an upcoming [facility] but referral to surgical pancreaticobiliary will be helpful for decisions.  These results will be scanned into chart and sent to patient as well.

## 2023-03-29 ENCOUNTER — Other Ambulatory Visit: Payer: Self-pay

## 2023-03-29 NOTE — Progress Notes (Signed)
The proposed treatment discussed in conference is for discussion purpose only and is not a binding recommendation.  The patients have not been physically examined, or presented with their treatment options.  Therefore, final treatment plans cannot be decided.  

## 2023-03-30 ENCOUNTER — Encounter: Payer: Self-pay | Admitting: Gastroenterology

## 2023-04-02 NOTE — Progress Notes (Signed)
Patient's case discussed at [facility]. 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 Discussion with a surgeon to consider surgical management was the plan of action. We had already placed a referral. I will have my team reach out to him and ensure everything is moving forward with the referral.   Corliss Parish, MD [facility] Office # [phone]

## 2023-04-03 ENCOUNTER — Telehealth: Payer: Self-pay

## 2023-04-03 NOTE — Progress Notes (Signed)
Thank you Beth for doing this. GM

## 2023-04-03 NOTE — Telephone Encounter (Signed)
[facility] has not been successful in reaching the patient.  "Telephone Encounter - Marnette Burgess - 03/27/2023 1:38 PM EDT Formatting of this note might be different from the original. Called number on file, phone rang and was disconnected, 3 attempts//Mucinous cyst of neoplasm ltf, consideration of sx optons//Dr. SA (referral received from [employer]) Electronically signed by Marnette Burgess at 03/27/2023 1:40 PM EDT "  Attempted to contact the patient myself. No answer and no voicemail on the home phone number as listed. Called the cell phone number. Left a message of my call and asked for a return call.

## 2023-04-03 NOTE — Telephone Encounter (Signed)
Spoke with [facility]. They have removed the referral because the patient's insurance was not current. The referral is still in the [facility] system and they can retrieve the referral. I called the patient. He answers and tells me the insurance is current. "I just paid the premium." He agrees to call [facility] and set up the appointment.

## 2023-04-03 NOTE — Telephone Encounter (Signed)
Thank you for this update. GM

## 2023-04-05 NOTE — Progress Notes (Signed)
The pt has been set up for 05/19/23 at 2:40 pm with Dr Freida Busman at [facility].  The pt has been advised

## 2023-04-05 NOTE — Progress Notes (Signed)
Thanks for update Patty.

## 2023-04-11 ENCOUNTER — Encounter: Payer: Self-pay | Admitting: Gastroenterology

## 2023-05-08 ENCOUNTER — Other Ambulatory Visit (INDEPENDENT_AMBULATORY_CARE_PROVIDER_SITE_OTHER): Payer: Self-pay | Admitting: Primary Care

## 2023-05-08 DIAGNOSIS — I10 Essential (primary) hypertension: Secondary | ICD-10-CM

## 2023-05-19 ENCOUNTER — Ambulatory Visit: Payer: Self-pay | Admitting: Surgery

## 2023-05-19 DIAGNOSIS — K862 Cyst of pancreas: Secondary | ICD-10-CM

## 2023-06-25 ENCOUNTER — Other Ambulatory Visit (INDEPENDENT_AMBULATORY_CARE_PROVIDER_SITE_OTHER): Payer: Self-pay | Admitting: Primary Care

## 2023-06-27 NOTE — Telephone Encounter (Signed)
Requested Prescriptions  Pending Prescriptions Disp Refills   atorvastatin (LIPITOR) 10 MG tablet [Pharmacy Med Name: Atorvastatin Calcium 10 MG Oral Tablet] 90 tablet 0    Sig: Take 1 tablet by mouth once daily     Cardiovascular:  Antilipid - Statins Failed - 06/25/2023  7:51 AM      Failed - Lipid Panel in normal range within the last 12 months    Cholesterol, Total  Date Value Ref Range Status  12/30/2022 163 100 - 199 mg/dL Final   LDL Chol Calc (NIH)  Date Value Ref Range Status  12/30/2022 101 (H) 0 - 99 mg/dL Final   HDL  Date Value Ref Range Status  12/30/2022 51 >39 mg/dL Final   Triglycerides  Date Value Ref Range Status  12/30/2022 54 0 - 149 mg/dL Final         Passed - Patient is not pregnant      Passed - Valid encounter within last 12 months    Recent Outpatient Visits           5 months ago Essential hypertension   [facility] [name], NP   12 months ago Essential hypertension   [place] [name], NP   1 year ago Prostate cancer [facility])   [place] [facility] [name], NP       Future Appointments             In 1 week Randa Evens, Kinnie Scales, NP [facility]

## 2023-07-06 ENCOUNTER — Telehealth (INDEPENDENT_AMBULATORY_CARE_PROVIDER_SITE_OTHER): Payer: Self-pay

## 2023-07-06 NOTE — Telephone Encounter (Signed)
Copied from CRM [id number]. Topic: General - Inquiry >> Jul 06, 2023 10:41 AM Shon Hale wrote: Reason for CRM: Pt states he has appt tomorrow and will be getting lab work done then. Pt also has to do labwork next week for pre-op. Pt inquiring if he can get all the labwork done at once instead of having to get blood drawn twice. Pt does not want to get "poked twice in one month".   Please call pt back to discuss.

## 2023-07-06 NOTE — Telephone Encounter (Signed)
Please reach out to pt and make aware that the provider that is doing his surgery order pre-op testing just for that unfortunately we are unable to draw their labs.   His labs for his appt would be to check his kidney and liver function and his cholesterol level.   So he would need to get labs at both appointments.

## 2023-07-06 NOTE — Telephone Encounter (Signed)
Noted  

## 2023-07-07 ENCOUNTER — Encounter (INDEPENDENT_AMBULATORY_CARE_PROVIDER_SITE_OTHER): Payer: Self-pay | Admitting: Primary Care

## 2023-07-07 ENCOUNTER — Ambulatory Visit (INDEPENDENT_AMBULATORY_CARE_PROVIDER_SITE_OTHER): Payer: Self-pay | Admitting: Primary Care

## 2023-07-07 VITALS — BP 125/79 | HR 69 | Resp 16 | Wt 240.0 lb

## 2023-07-07 DIAGNOSIS — Z6832 Body mass index (BMI) 32.0-32.9, adult: Secondary | ICD-10-CM

## 2023-07-07 DIAGNOSIS — I10 Essential (primary) hypertension: Secondary | ICD-10-CM

## 2023-07-07 DIAGNOSIS — E669 Obesity, unspecified: Secondary | ICD-10-CM

## 2023-07-07 DIAGNOSIS — E66811 Obesity, class 1: Secondary | ICD-10-CM

## 2023-07-07 DIAGNOSIS — Z01818 Encounter for other preprocedural examination: Secondary | ICD-10-CM

## 2023-07-07 DIAGNOSIS — E782 Mixed hyperlipidemia: Secondary | ICD-10-CM

## 2023-07-07 DIAGNOSIS — Z2821 Immunization not carried out because of patient refusal: Secondary | ICD-10-CM

## 2023-07-07 NOTE — Progress Notes (Unsigned)
[facility]  Jesus Hamilton, is a 52 y.o. male  WUJ:[id number]  [id number]  DOB - [birth year]  Chief Complaint  Patient presents with   Hypertension       Subjective:   Jesus Hamilton is a 52 y.o. male here today for a follow up visit. Patient has No headache, No chest pain, No abdominal pain - No Nausea, No new weakness tingling or numbness, No Cough - shortness of breath Hypertension This is a chronic problem. The current episode started more than 1 year ago. The problem has been resolved since onset. The problem is controlled. Risk factors for coronary artery disease include male gender, obesity and dyslipidemia. Past treatments include ACE inhibitors and diuretics. The current treatment provides significant improvement. There are no compliance problems.     No problems updated.  Comprehensive ROS Pertinent positive and negative noted in HPI   No Known Allergies  Past Medical History:  Diagnosis Date   COVID-19 03/15/2021   GERD (gastroesophageal reflux disease)    Hyperlipidemia    Hypertension    Prostate cancer (HCC)     Current Outpatient Medications on File Prior to Visit  Medication Sig Dispense Refill   atorvastatin (LIPITOR) 10 MG tablet Take 1 tablet by mouth once daily 90 tablet 0   lisinopril-hydrochlorothiazide (ZESTORETIC) 10-12.5 MG tablet Take 1 tablet by mouth once daily 90 tablet 0   No current facility-administered medications on file prior to visit.   Health Maintenance  Topic Date Due   COVID-19 Vaccine (4 - 2024-25 season) 03/19/2023   Zoster (Shingles) Vaccine (1 of 2) 10/05/2023*   Flu Shot  10/16/2023*   DTaP/Tdap/Td vaccine (2 - Td or Tdap) 09/06/2024   Colon Cancer Screening  11/24/2032   Hepatitis C Screening  Completed   HIV Screening  Completed   HPV Vaccine  Aged Out  *Topic was postponed. The date shown is not the original due date.    Objective:   Vitals:   07/07/23 0847  BP: 125/79  Pulse: 69  Resp: 16   SpO2: 100%  Weight: 240 lb (108.9 kg)   BP Readings from Last 3 Encounters:  07/07/23 125/79  03/02/23 133/71  12/30/22 111/70      Physical Exam Constitutional:      Appearance: He is obese.  HENT:     Head: Normocephalic.     Right Ear: Tympanic membrane and external ear normal.     Left Ear: Tympanic membrane and external ear normal.     Nose: Nose normal.  Eyes:     Extraocular Movements: Extraocular movements intact.     Pupils: Pupils are equal, round, and reactive to light.  Cardiovascular:     Rate and Rhythm: Normal rate and regular rhythm.  Pulmonary:     Effort: Pulmonary effort is normal.     Breath sounds: Normal breath sounds.  Abdominal:     General: Bowel sounds are normal. There is distension.     Palpations: Abdomen is soft.  Musculoskeletal:        General: Normal range of motion.     Cervical back: Normal range of motion and neck supple.  Skin:    General: Skin is warm and dry.  Neurological:     Mental Status: He is alert and oriented to person, place, and time.  Psychiatric:        Mood and Affect: Mood normal.        Behavior: Behavior normal.       Assessment &  Plan  Odus was seen today for hypertension.  Diagnoses and all orders for this visit:  Essential hypertension Bp controlled at  goal and medications are helping to get to goal and maintain normal blood pressure. DIET: Limit salt intake, read nutrition labels to check salt content, limit fried and high fatty foods  Avoid using multisymptom OTC cold preparations that generally contain sudafed which can rise BP. Consult with pharmacist on best cold relief products to use for persons with HTN EXERCISE Discussed incorporating exercise such as walking - 30 minutes most days of the week and can do in 10 minute intervals    -     CBC with Differential/Platelet -     CMP14+EGFR  Mixed hyperlipidemia  Healthy lifestyle diet of fruits vegetables fish nuts whole grains and low  saturated fat . Foods high in cholesterol or liver, fatty meats,cheese, butter avocados, nuts and seeds, chocolate and fried foods.    -     Lipid panel  Influenza vaccination declined  Herpes zoster vaccination declined  Pre-op exam -     Protime-INR      Patient have been counseled extensively about nutrition and exercise. Other issues discussed during this visit include: low cholesterol diet, weight control and daily exercise, foot care, annual eye examinations at Ophthalmology, importance of adherence with medications and regular follow-up. We also discussed long term complications of uncontrolled diabetes and hypertension.   Return in about 6 months (around 01/05/2024) for medical conditions.  The patient was given clear instructions to go to ER or return to medical center if symptoms don't improve, worsen or new problems develop. The patient verbalized understanding. The patient was told to call to get lab results if they haven't heard anything in the next week.   This note has been created with Education officer, environmental. Any transcriptional errors are unintentional.   [name], NP 07/10/2023, 11:28 AM

## 2023-07-08 LAB — CBC WITH DIFFERENTIAL/PLATELET
Basophils Absolute: 0 10*3/uL (ref 0.0–0.2)
Basos: 0 %
EOS (ABSOLUTE): 0.2 10*3/uL (ref 0.0–0.4)
Eos: 3 %
Hematocrit: 42.1 % (ref 37.5–51.0)
Hemoglobin: 13.8 g/dL (ref 13.0–17.7)
Immature Grans (Abs): 0 10*3/uL (ref 0.0–0.1)
Immature Granulocytes: 0 %
Lymphocytes Absolute: 1.9 10*3/uL (ref 0.7–3.1)
Lymphs: 27 %
MCH: 28.6 pg (ref 26.6–33.0)
MCHC: 32.8 g/dL (ref 31.5–35.7)
MCV: 87 fL (ref 79–97)
Monocytes Absolute: 0.7 10*3/uL (ref 0.1–0.9)
Monocytes: 10 %
Neutrophils Absolute: 4.3 10*3/uL (ref 1.4–7.0)
Neutrophils: 60 %
Platelets: 302 10*3/uL (ref 150–450)
RBC: 4.83 x10E6/uL (ref 4.14–5.80)
RDW: 12.8 % (ref 11.6–15.4)
WBC: 7.3 10*3/uL (ref 3.4–10.8)

## 2023-07-08 LAB — CMP14+EGFR
ALT: 19 [IU]/L (ref 0–44)
AST: 19 [IU]/L (ref 0–40)
Albumin: 4.2 g/dL (ref 3.8–4.9)
Alkaline Phosphatase: 59 [IU]/L (ref 44–121)
BUN/Creatinine Ratio: 19 (ref 9–20)
BUN: 15 mg/dL (ref 6–24)
Bilirubin Total: 0.6 mg/dL (ref 0.0–1.2)
CO2: 23 mmol/L (ref 20–29)
Calcium: 9.4 mg/dL (ref 8.7–10.2)
Chloride: 102 mmol/L (ref 96–106)
Creatinine, Ser: 0.79 mg/dL (ref 0.76–1.27)
Globulin, Total: 2.7 g/dL (ref 1.5–4.5)
Glucose: 77 mg/dL (ref 70–99)
Potassium: 4.5 mmol/L (ref 3.5–5.2)
Sodium: 142 mmol/L (ref 134–144)
Total Protein: 6.9 g/dL (ref 6.0–8.5)
eGFR: 107 mL/min/{1.73_m2} (ref >59)

## 2023-07-08 LAB — LIPID PANEL
Chol/HDL Ratio: 3.5 {ratio} (ref 0.0–5.0)
Cholesterol, Total: 181 mg/dL (ref 100–199)
HDL: 52 mg/dL (ref >39)
LDL Chol Calc (NIH): 118 mg/dL — ABNORMAL HIGH (ref 0–99)
Triglycerides: 56 mg/dL (ref 0–149)
VLDL Cholesterol Cal: 11 mg/dL (ref 5–40)

## 2023-07-10 NOTE — Progress Notes (Signed)
Surgical Instructions   Your procedure is scheduled on Friday July 21, 2023. Report to [facility] Main Entrance "A" at 5:30 A.M., then check in with the Admitting office. Any questions or running late day of surgery: call [phone]  Questions prior to your surgery date: call [phone], Monday-Friday, 8am-4pm. If you experience any cold or flu symptoms such as cough, fever, chills, shortness of breath, etc. between now and your scheduled surgery, please notify us at the above number.     Remember:  Do not eat after midnight the night before your surgery   You may drink clear liquids until 4:30 the morning of your surgery.   Clear liquids allowed are: Water, Non-Citrus Juices (without pulp), Carbonated Beverages, Clear Tea (no milk, honey, etc.), Black Coffee Only (NO MILK, CREAM OR POWDERED CREAMER of any kind), and Gatorade.  Patient Instructions  The night before surgery:  No food after midnight. ONLY clear liquids after midnight  The day of surgery (if you do NOT have diabetes):  Drink ONE (1) Pre-Surgery Clear Ensure by 4:30 the morning of surgery. Drink in one sitting. Do not sip.  This drink was given to you during your hospital  pre-op appointment visit.  Nothing else to drink after completing the  Pre-Surgery Clear Ensure.         If you have questions, please contact your surgeon's office.   Take these medicines the morning of surgery with A SIP OF WATER  atorvastatin (LIPITOR)    One week prior to surgery, STOP taking any Aspirin (unless otherwise instructed by your surgeon) Aleve, Naproxen, Ibuprofen, Motrin, Advil, Goody's, BC's, all herbal medications, fish oil, and non-prescription vitamins.                     Do NOT Smoke (Tobacco/Vaping) for 24 hours prior to your procedure.  If you use a CPAP at night, you may bring your mask/headgear for your overnight stay.   You will be asked to remove any contacts, glasses, piercing's, hearing aid's,  dentures/partials prior to surgery. Please bring cases for these items if needed.    Patients discharged the day of surgery will not be allowed to drive home, and someone needs to stay with them for 24 hours.  SURGICAL WAITING ROOM VISITATION Patients may have no more than 2 support people in the waiting area - these visitors may rotate.   Pre-op nurse will coordinate an appropriate time for 1 ADULT support person, who may not rotate, to accompany patient in pre-op.  Children under the age of 62 must have an adult with them who is not the patient and must remain in the main waiting area with an adult.  If the patient needs to stay at the hospital during part of their recovery, the visitor guidelines for inpatient rooms apply.  Please refer to the [facility] website for the visitor guidelines for any additional information.   If you received a COVID test during your pre-op visit  it is requested that you wear a mask when out in public, stay away from anyone that may not be feeling well and notify your surgeon if you develop symptoms. If you have been in contact with anyone that has tested positive in the last 10 days please notify you surgeon.      Pre-operative CHG Bathing Instructions   You can play a key role in reducing the risk of infection after surgery. Your skin needs to be as free of germs as possible.  You can reduce the number of germs on your skin by washing with CHG (chlorhexidine gluconate) soap before surgery. CHG is an antiseptic soap that kills germs and continues to kill germs even after washing.   DO NOT use if you have an allergy to chlorhexidine/CHG or antibacterial soaps. If your skin becomes reddened or irritated, stop using the CHG and notify one of our RNs at [phone].              TAKE A SHOWER THE NIGHT BEFORE SURGERY AND THE DAY OF SURGERY    Please keep in mind the following:  DO NOT shave, including legs and underarms, 48 hours prior to surgery.   You may  shave your face before/day of surgery.  Place clean sheets on your bed the night before surgery Use a clean washcloth (not used since being washed) for each shower. DO NOT sleep with pet's night before surgery.  CHG Shower Instructions:  Wash your face and private area with normal soap. If you choose to wash your hair, wash first with your normal shampoo.  After you use shampoo/soap, rinse your hair and body thoroughly to remove shampoo/soap residue.  Turn the water OFF and apply half the bottle of CHG soap to a CLEAN washcloth.  Apply CHG soap ONLY FROM YOUR NECK DOWN TO YOUR TOES (washing for 3-5 minutes)  DO NOT use CHG soap on face, private areas, open wounds, or sores.  Pay special attention to the area where your surgery is being performed.  If you are having back surgery, having someone wash your back for you may be helpful. Wait 2 minutes after CHG soap is applied, then you may rinse off the CHG soap.  Pat dry with a clean towel  Put on clean pajamas    Additional instructions for the day of surgery: DO NOT APPLY any lotions, deodorants or cologne.    Do not wear jewelry Do not bring valuables to the hospital. [facility] is not responsible for valuables/personal belongings. Put on clean/comfortable clothes.  Please brush your teeth.  Ask your nurse before applying any prescription medications to the skin.

## 2023-07-13 ENCOUNTER — Encounter (HOSPITAL_COMMUNITY)
Admission: RE | Admit: 2023-07-13 | Discharge: 2023-07-13 | Disposition: A | Discharge status: Home / Self Care | Payer: 59 | Source: Ambulatory Visit | Attending: Surgery | Admitting: Surgery

## 2023-07-13 ENCOUNTER — Encounter (HOSPITAL_COMMUNITY): Payer: Self-pay

## 2023-07-13 ENCOUNTER — Other Ambulatory Visit: Payer: Self-pay

## 2023-07-13 VITALS — BP 124/74 | HR 74 | Temp 98.5°F | Resp 18 | Ht 72.0 in | Wt 239.6 lb

## 2023-07-13 DIAGNOSIS — Z8546 Personal history of malignant neoplasm of prostate: Secondary | ICD-10-CM | POA: Insufficient documentation

## 2023-07-13 DIAGNOSIS — I10 Essential (primary) hypertension: Secondary | ICD-10-CM | POA: Insufficient documentation

## 2023-07-13 DIAGNOSIS — K219 Gastro-esophageal reflux disease without esophagitis: Secondary | ICD-10-CM | POA: Diagnosis not present

## 2023-07-13 DIAGNOSIS — E785 Hyperlipidemia, unspecified: Secondary | ICD-10-CM | POA: Insufficient documentation

## 2023-07-13 DIAGNOSIS — Z01818 Encounter for other preprocedural examination: Secondary | ICD-10-CM | POA: Insufficient documentation

## 2023-07-13 DIAGNOSIS — D490 Neoplasm of unspecified behavior of digestive system: Secondary | ICD-10-CM | POA: Insufficient documentation

## 2023-07-13 DIAGNOSIS — K862 Cyst of pancreas: Secondary | ICD-10-CM | POA: Insufficient documentation

## 2023-07-13 LAB — CBC
HCT: 41.9 % (ref 39.0–52.0)
Hemoglobin: 13.6 g/dL (ref 13.0–17.0)
MCH: 28.2 pg (ref 26.0–34.0)
MCHC: 32.5 g/dL (ref 30.0–36.0)
MCV: 86.9 fL (ref 80.0–100.0)
Platelets: 285 10*3/uL (ref 150–400)
RBC: 4.82 MIL/uL (ref 4.22–5.81)
RDW: 13.3 % (ref 11.5–15.5)
WBC: 5.8 10*3/uL (ref 4.0–10.5)
nRBC: 0 % (ref 0.0–0.2)

## 2023-07-13 LAB — BASIC METABOLIC PANEL
Anion gap: 8 (ref 5–15)
BUN: 15 mg/dL (ref 6–20)
CO2: 28 mmol/L (ref 22–32)
Calcium: 9 mg/dL (ref 8.9–10.3)
Chloride: 103 mmol/L (ref 98–111)
Creatinine, Ser: 0.74 mg/dL (ref 0.61–1.24)
GFR, Estimated: >60 mL/min (ref >60)
Glucose, Bld: 82 mg/dL (ref 70–99)
Potassium: 3.5 mmol/L (ref 3.5–5.1)
Sodium: 139 mmol/L (ref 135–145)

## 2023-07-13 LAB — SURGICAL PCR SCREEN
MRSA, PCR: NEGATIVE
Staphylococcus aureus: NEGATIVE

## 2023-07-13 NOTE — Progress Notes (Signed)
PCP - [facility] Cardiologist -   PPM/ICD - denies Device Orders - na Rep Notified - na  Chest x-ray - na EKG - PAT, 07/13/2023 Stress Test -  ECHO -  Cardiac Cath -   Sleep Study - denies CPAP - na  Non-diabetic  Blood Thinner Instructions:denies Aspirin Instructions:denies  ERAS Protcol -Ensure until 0430  COVID TEST- na  Anesthesia review: Yes. HTN, high cholesterol  Patient denies shortness of breath, fever, cough and chest pain at PAT appointment   All instructions explained to the patient, with a verbal understanding of the material. Patient agrees to go over the instructions while at home for a better understanding. Patient also instructed to self quarantine after being tested for COVID-19. The opportunity to ask questions was provided.

## 2023-07-13 NOTE — Progress Notes (Signed)
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  Above procedure recommended.   History includes never smoker, HTN, GERD, HLD, prostate cancer (s/p robot assisted laparoscopic radical prostatectomy 04/21/21), pancreatic mass.   Anesthesia team to evaluate on the day of surgery.   VS: BP 124/74   Pulse 74   Temp 36.9 C   Resp 18   Ht 6' (1.829 m)   Wt 108.7 kg   SpO2 100%   BMI 32.50 kg/m    PROVIDERS: PCP - [facility]    LABS: Labs reviewed: Acceptable for surgery. LFTs normal on 07/07/23. A1c 5.8% on 12/31/21.  (all labs ordered are listed, but only abnormal results are displayed)  Labs Reviewed  SURGICAL PCR SCREEN  BASIC METABOLIC PANEL  CBC  TYPE AND SCREEN    IMAGES: MRI Abd 12/17/22: IMPRESSION: Stable thick-walled 3.6 cm multi septated cystic lesion in the tail of the pancreas. No enhancing nodularity or pancreatic ductal dilation. Findings are favored to reflect a cystic pancreatic neoplasm with alternate differential considerations of a complex pancreatic pseudocyst. Given its size, multiple septations and wall thickening this warrants EUS/FNA and/or surgical consult if not previously performed. At minimum continued pre and postcontrast enhanced MRCP surveillance in 6 months suggested.    EKG: 07/13/23: NSR   CV: N/A  Past Medical History:  Diagnosis Date   COVID-19 03/15/2021   GERD (gastroesophageal reflux disease)    Hyperlipidemia    Hypertension    Prostate cancer [facility])     Past Surgical  History:  Procedure Laterality Date   BIOPSY  03/02/2023   Procedure: BIOPSY;  Surgeon: Lemar Lofty., MD;  Location: [facility];  Service: Gastroenterology;;   ESOPHAGOGASTRODUODENOSCOPY N/A 03/02/2023   Procedure: ESOPHAGOGASTRODUODENOSCOPY (EGD);  Surgeon: Lemar Lofty., MD;  Location: [facility];  Service: Gastroenterology;  Laterality: N/A;   EUS N/A 03/02/2023   Procedure: UPPER ENDOSCOPIC ULTRASOUND (EUS) RADIAL;  Surgeon: Lemar Lofty., MD;  Location: [facility];  Service: Gastroenterology;  Laterality: N/A;   FINE NEEDLE ASPIRATION N/A 03/02/2023   Procedure: FINE NEEDLE ASPIRATION (FNA) LINEAR;  Surgeon: Lemar Lofty., MD;  Location: [facility];  Service: Gastroenterology;  Laterality: N/A;   HAND SURGERY Right    PELVIC LYMPH NODE DISSECTION Bilateral 04/21/2021   Procedure: BILATERAL PELVIC LYMPH NODE DISSECTION;  Surgeon: Crist Fat, MD;  Location: [facility];  Service: Urology;  Laterality: Bilateral;   PROSTATE BIOPSY     ROBOT ASSISTED LAPAROSCOPIC RADICAL PROSTATECTOMY N/A 04/21/2021   Procedure: XI ROBOTIC ASSISTED LAPAROSCOPIC RADICAL PROSTATECTOMY;  Surgeon: Crist Fat, MD;  Location: [facility];  Service: Urology;  Laterality: N/A;    MEDICATIONS:  atorvastatin (LIPITOR) 10 MG tablet   lisinopril-hydrochlorothiazide (ZESTORETIC) 10-12.5 MG tablet   No current facility-administered medications for this encounter.    Shonna Chock, PA-C Surgical Short Stay/Anesthesiology [facility] Phone [phone] [facility] Phone [phone] 07/13/2023 6:29 PM

## 2023-07-20 ENCOUNTER — Encounter (HOSPITAL_COMMUNITY): Payer: Self-pay | Admitting: Surgery

## 2023-07-20 NOTE — Anesthesia Preprocedure Evaluation (Addendum)
 Anesthesia Evaluation  Patient identified by MRN, date of birth, ID band Patient awake    Reviewed: Allergy & Precautions, NPO status , Patient's Chart, lab work & pertinent test results  Airway Mallampati: II       Dental no notable dental hx.    Pulmonary neg pulmonary ROS   Pulmonary exam normal breath sounds clear to auscultation       Cardiovascular hypertension, Pt. on medications Normal cardiovascular exam Rhythm:Regular Rate:Normal     Neuro/Psych negative neurological ROS  negative psych ROS   GI/Hepatic Neg liver ROS,GERD  Medicated,,Mucinous cystic lesion of pancreas   Endo/Other  Hyperlipidemia Obesity  Renal/GU negative Renal ROS   Prostate Ca    Musculoskeletal negative musculoskeletal ROS (+)    Abdominal  (+) + obese  Peds  Hematology negative hematology ROS (+)   Anesthesia Other Findings   Reproductive/Obstetrics                              Anesthesia Physical Anesthesia Plan  ASA: 2  Anesthesia Plan: General   Post-op Pain Management: Dilaudid  IV, Precedex , Ofirmev  IV (intra-op)* and Ketamine  IV*   Induction: Intravenous and Cricoid pressure planned  PONV Risk Score and Plan: 4 or greater and Treatment may vary due to age or medical condition, Midazolam , Ondansetron  and Dexamethasone   Airway Management Planned: Oral ETT  Additional Equipment: Arterial line and None  Intra-op Plan:   Post-operative Plan: Extubation in OR  Informed Consent: I have reviewed the patients History and Physical, chart, labs and discussed the procedure including the risks, benefits and alternatives for the proposed anesthesia with the patient or authorized representative who has indicated his/her understanding and acceptance.     Dental advisory given  Plan Discussed with: CRNA and Anesthesiologist  Anesthesia Plan Comments:          Anesthesia Quick  Evaluation

## 2023-07-21 ENCOUNTER — Inpatient Hospital Stay (HOSPITAL_COMMUNITY)
Admission: RE | Admit: 2023-07-21 | Discharge: 2023-07-25 | DRG: 406 | Disposition: A | Discharge status: Home / Self Care | Payer: Commercial Managed Care - HMO | Attending: Surgery | Admitting: Surgery

## 2023-07-21 ENCOUNTER — Other Ambulatory Visit: Payer: Self-pay

## 2023-07-21 ENCOUNTER — Inpatient Hospital Stay (HOSPITAL_COMMUNITY): Payer: Commercial Managed Care - HMO

## 2023-07-21 ENCOUNTER — Encounter (HOSPITAL_COMMUNITY): Payer: Self-pay | Admitting: Surgery

## 2023-07-21 ENCOUNTER — Inpatient Hospital Stay (HOSPITAL_COMMUNITY): Payer: Commercial Managed Care - HMO | Admitting: Vascular Surgery

## 2023-07-21 ENCOUNTER — Encounter (HOSPITAL_COMMUNITY)
Admission: RE | Disposition: A | Discharge status: Home / Self Care | Payer: Self-pay | Source: Home / Self Care | Attending: Surgery

## 2023-07-21 DIAGNOSIS — Z9079 Acquired absence of other genital organ(s): Secondary | ICD-10-CM | POA: Diagnosis not present

## 2023-07-21 DIAGNOSIS — C61 Malignant neoplasm of prostate: Secondary | ICD-10-CM | POA: Diagnosis present

## 2023-07-21 DIAGNOSIS — Z8616 Personal history of COVID-19: Secondary | ICD-10-CM

## 2023-07-21 DIAGNOSIS — K567 Ileus, unspecified: Secondary | ICD-10-CM | POA: Diagnosis not present

## 2023-07-21 DIAGNOSIS — I10 Essential (primary) hypertension: Secondary | ICD-10-CM | POA: Diagnosis present

## 2023-07-21 DIAGNOSIS — K862 Cyst of pancreas: Secondary | ICD-10-CM | POA: Diagnosis present

## 2023-07-21 DIAGNOSIS — Z79899 Other long term (current) drug therapy: Secondary | ICD-10-CM

## 2023-07-21 DIAGNOSIS — E785 Hyperlipidemia, unspecified: Secondary | ICD-10-CM | POA: Diagnosis present

## 2023-07-21 DIAGNOSIS — D136 Benign neoplasm of pancreas: Secondary | ICD-10-CM | POA: Diagnosis not present

## 2023-07-21 HISTORY — PX: PANCREATECTOMY: SHX5261

## 2023-07-21 LAB — PREPARE RBC (CROSSMATCH)

## 2023-07-21 SURGERY — PANCREATECTOMY, LAPAROSCOPIC
Anesthesia: General | Site: Abdomen

## 2023-07-21 MED ORDER — HYDROMORPHONE HCL 1 MG/ML IJ SOLN
INTRAMUSCULAR | Status: AC
  Administered 2023-07-21: 0.5 mg via INTRAVENOUS
  Filled 2023-07-21: qty 1

## 2023-07-21 MED ORDER — DOCUSATE SODIUM 100 MG PO CAPS
100.0000 mg | ORAL_CAPSULE | Freq: Two times a day (BID) | ORAL | Status: DC
  Administered 2023-07-21 – 2023-07-25 (×8): 100 mg via ORAL
  Filled 2023-07-21 (×8): qty 1

## 2023-07-21 MED ORDER — ONDANSETRON HCL 4 MG/2ML IJ SOLN
INTRAMUSCULAR | Status: AC
  Filled 2023-07-21: qty 2

## 2023-07-21 MED ORDER — ONDANSETRON 4 MG PO TBDP: 4.0000 mg | ORAL_TABLET | Freq: Four times a day (QID) | ORAL | Status: DC | PRN

## 2023-07-21 MED ORDER — HEMOSTATIC AGENTS (NO CHARGE) OPTIME
TOPICAL | Status: DC | PRN
Start: 2023-07-21 — End: 2023-07-21
  Administered 2023-07-21 (×2): 1 via TOPICAL

## 2023-07-21 MED ORDER — PHENYLEPHRINE 80 MCG/ML (10ML) SYRINGE FOR IV PUSH (FOR BLOOD PRESSURE SUPPORT)
PREFILLED_SYRINGE | INTRAVENOUS | Status: DC | PRN
  Administered 2023-07-21: 160 ug via INTRAVENOUS
  Administered 2023-07-21: 80 ug via INTRAVENOUS

## 2023-07-21 MED ORDER — FENTANYL CITRATE (PF) 250 MCG/5ML IJ SOLN
INTRAMUSCULAR | Status: DC | PRN
  Administered 2023-07-21: 100 ug via INTRAVENOUS
  Administered 2023-07-21: 150 ug via INTRAVENOUS

## 2023-07-21 MED ORDER — POLYETHYLENE GLYCOL 3350 17 G PO PACK: 17.0000 g | PACK | Freq: Every day | ORAL | Status: DC | PRN

## 2023-07-21 MED ORDER — OXYCODONE HCL 5 MG PO TABS
5.0000 mg | ORAL_TABLET | ORAL | Status: DC | PRN
  Administered 2023-07-21: 10 mg via ORAL
  Administered 2023-07-22: 5 mg via ORAL
  Administered 2023-07-22 (×2): 10 mg via ORAL
  Administered 2023-07-22: 5 mg via ORAL
  Administered 2023-07-23 (×2): 10 mg via ORAL
  Administered 2023-07-23: 5 mg via ORAL
  Administered 2023-07-24 (×2): 10 mg via ORAL
  Administered 2023-07-24: 5 mg via ORAL
  Administered 2023-07-25: 10 mg via ORAL
  Filled 2023-07-21 (×2): qty 1
  Filled 2023-07-21 (×4): qty 2
  Filled 2023-07-21: qty 1
  Filled 2023-07-21 (×3): qty 2
  Filled 2023-07-21 (×2): qty 1
  Filled 2023-07-21: qty 2

## 2023-07-21 MED ORDER — ARTIFICIAL TEARS OPHTHALMIC OINT
TOPICAL_OINTMENT | OPHTHALMIC | Status: AC
  Filled 2023-07-21: qty 3.5

## 2023-07-21 MED ORDER — DIPHENHYDRAMINE HCL 25 MG PO CAPS: 25.0000 mg | ORAL_CAPSULE | Freq: Four times a day (QID) | ORAL | Status: DC | PRN

## 2023-07-21 MED ORDER — PROPOFOL 10 MG/ML IV BOLUS
INTRAVENOUS | Status: AC
  Filled 2023-07-21: qty 20

## 2023-07-21 MED ORDER — ENSURE PRE-SURGERY PO LIQD: 592.0000 mL | Freq: Once | ORAL | Status: DC

## 2023-07-21 MED ORDER — 0.9 % SODIUM CHLORIDE (POUR BTL) OPTIME
TOPICAL | Status: DC | PRN
  Administered 2023-07-21 (×2): 1000 mL

## 2023-07-21 MED ORDER — ATORVASTATIN CALCIUM 10 MG PO TABS
10.0000 mg | ORAL_TABLET | Freq: Every day | ORAL | Status: DC
  Administered 2023-07-22 – 2023-07-25 (×4): 10 mg via ORAL
  Filled 2023-07-21 (×4): qty 1

## 2023-07-21 MED ORDER — CHLORHEXIDINE GLUCONATE 0.12 % MT SOLN: 15.0000 mL | Freq: Once | OROMUCOSAL | Status: AC

## 2023-07-21 MED ORDER — ACETAMINOPHEN 10 MG/ML IV SOLN
INTRAVENOUS | Status: AC
  Filled 2023-07-21: qty 100

## 2023-07-21 MED ORDER — ACETAMINOPHEN 10 MG/ML IV SOLN
INTRAVENOUS | Status: DC | PRN
  Administered 2023-07-21: 1000 mg via INTRAVENOUS

## 2023-07-21 MED ORDER — ONDANSETRON HCL 4 MG/2ML IJ SOLN: 4.0000 mg | Freq: Once | INTRAMUSCULAR | Status: DC | PRN

## 2023-07-21 MED ORDER — PROPOFOL 10 MG/ML IV BOLUS
INTRAVENOUS | Status: DC | PRN
  Administered 2023-07-21: 200 mg via INTRAVENOUS

## 2023-07-21 MED ORDER — ORAL CARE MOUTH RINSE: 15.0000 mL | Freq: Once | OROMUCOSAL | Status: AC

## 2023-07-21 MED ORDER — DROPERIDOL 2.5 MG/ML IJ SOLN: 0.6250 mg | Freq: Once | INTRAMUSCULAR | Status: DC | PRN

## 2023-07-21 MED ORDER — ONDANSETRON HCL 4 MG/2ML IJ SOLN
INTRAMUSCULAR | Status: DC | PRN
  Administered 2023-07-21 (×2): 4 mg via INTRAVENOUS

## 2023-07-21 MED ORDER — HYDROMORPHONE HCL 1 MG/ML IJ SOLN
0.5000 mg | INTRAMUSCULAR | Status: AC | PRN
Start: 2023-07-21 — End: 2023-07-21

## 2023-07-21 MED ORDER — LIDOCAINE 2% (20 MG/ML) 5 ML SYRINGE
INTRAMUSCULAR | Status: AC
  Filled 2023-07-21: qty 5

## 2023-07-21 MED ORDER — ACETAMINOPHEN 500 MG PO TABS
1000.0000 mg | ORAL_TABLET | Freq: Three times a day (TID) | ORAL | Status: DC
  Administered 2023-07-21 – 2023-07-25 (×12): 1000 mg via ORAL
  Filled 2023-07-21 (×11): qty 2

## 2023-07-21 MED ORDER — ALBUMIN HUMAN 5 % IV SOLN: INTRAVENOUS | Status: DC | PRN

## 2023-07-21 MED ORDER — SUCCINYLCHOLINE CHLORIDE 200 MG/10ML IV SOSY
PREFILLED_SYRINGE | INTRAVENOUS | Status: AC
  Filled 2023-07-21: qty 10

## 2023-07-21 MED ORDER — DEXAMETHASONE SODIUM PHOSPHATE 10 MG/ML IJ SOLN
INTRAMUSCULAR | Status: DC | PRN
  Administered 2023-07-21: 10 mg via INTRAVENOUS

## 2023-07-21 MED ORDER — CEFAZOLIN SODIUM-DEXTROSE 2-4 GM/100ML-% IV SOLN
INTRAVENOUS | Status: AC
  Filled 2023-07-21: qty 100

## 2023-07-21 MED ORDER — LIDOCAINE 2% (20 MG/ML) 5 ML SYRINGE
INTRAMUSCULAR | Status: DC | PRN
  Administered 2023-07-21: 80 mg via INTRAVENOUS

## 2023-07-21 MED ORDER — KETAMINE HCL 50 MG/5ML IJ SOSY
PREFILLED_SYRINGE | INTRAMUSCULAR | Status: AC
  Filled 2023-07-21: qty 5

## 2023-07-21 MED ORDER — PHENYLEPHRINE 80 MCG/ML (10ML) SYRINGE FOR IV PUSH (FOR BLOOD PRESSURE SUPPORT)
PREFILLED_SYRINGE | INTRAVENOUS | Status: AC
  Filled 2023-07-21: qty 10

## 2023-07-21 MED ORDER — HYDROMORPHONE HCL 1 MG/ML IJ SOLN
0.5000 mg | INTRAMUSCULAR | Status: DC | PRN
  Administered 2023-07-21 – 2023-07-22 (×2): 0.5 mg via INTRAVENOUS
  Filled 2023-07-21 (×3): qty 0.5

## 2023-07-21 MED ORDER — METRONIDAZOLE 500 MG/100ML IV SOLN
500.0000 mg | INTRAVENOUS | Status: AC
  Administered 2023-07-21: 500 mg via INTRAVENOUS

## 2023-07-21 MED ORDER — GLYCOPYRROLATE PF 0.2 MG/ML IJ SOSY
PREFILLED_SYRINGE | INTRAMUSCULAR | Status: AC
  Filled 2023-07-21: qty 1

## 2023-07-21 MED ORDER — DEXMEDETOMIDINE HCL IN NACL 80 MCG/20ML IV SOLN
INTRAVENOUS | Status: DC | PRN
  Administered 2023-07-21 (×2): 10 ug via INTRAVENOUS
  Administered 2023-07-21: 20 ug via INTRAVENOUS

## 2023-07-21 MED ORDER — LACTATED RINGERS IV SOLN: INTRAVENOUS | Status: DC | PRN

## 2023-07-21 MED ORDER — METRONIDAZOLE 500 MG/100ML IV SOLN
INTRAVENOUS | Status: AC
  Filled 2023-07-21: qty 100

## 2023-07-21 MED ORDER — MIDAZOLAM HCL 2 MG/2ML IJ SOLN
INTRAMUSCULAR | Status: AC
  Filled 2023-07-21: qty 2

## 2023-07-21 MED ORDER — SODIUM CHLORIDE 0.9 % IV SOLN: 10.0000 mL/h | Freq: Once | INTRAVENOUS | Status: AC

## 2023-07-21 MED ORDER — SODIUM CHLORIDE 0.9 % IV SOLN
INTRAVENOUS | Status: DC | PRN
  Administered 2023-07-21: 40 mL

## 2023-07-21 MED ORDER — METHOCARBAMOL 1000 MG/10ML IJ SOLN
500.0000 mg | Freq: Three times a day (TID) | INTRAMUSCULAR | Status: DC
  Administered 2023-07-21 – 2023-07-23 (×6): 500 mg via INTRAVENOUS
  Filled 2023-07-21 (×4): qty 10
  Filled 2023-07-21: qty 5
  Filled 2023-07-21 (×2): qty 10

## 2023-07-21 MED ORDER — DIPHENHYDRAMINE HCL 50 MG/ML IJ SOLN: 25.0000 mg | Freq: Four times a day (QID) | INTRAMUSCULAR | Status: DC | PRN

## 2023-07-21 MED ORDER — NEOSTIGMINE METHYLSULFATE 3 MG/3ML IV SOSY
PREFILLED_SYRINGE | INTRAVENOUS | Status: AC
  Filled 2023-07-21: qty 3

## 2023-07-21 MED ORDER — EPHEDRINE 5 MG/ML INJ
INTRAVENOUS | Status: AC
  Filled 2023-07-21: qty 5

## 2023-07-21 MED ORDER — FENTANYL CITRATE (PF) 250 MCG/5ML IJ SOLN
INTRAMUSCULAR | Status: AC
  Filled 2023-07-21: qty 5

## 2023-07-21 MED ORDER — VASOPRESSIN 20 UNIT/ML IV SOLN
INTRAVENOUS | Status: AC
  Filled 2023-07-21: qty 1

## 2023-07-21 MED ORDER — SUGAMMADEX SODIUM 200 MG/2ML IV SOLN
INTRAVENOUS | Status: DC | PRN
  Administered 2023-07-21: 400 mg via INTRAVENOUS

## 2023-07-21 MED ORDER — PHENYLEPHRINE HCL-NACL 20-0.9 MG/250ML-% IV SOLN
INTRAVENOUS | Status: DC | PRN
  Administered 2023-07-21: 20 ug/min via INTRAVENOUS

## 2023-07-21 MED ORDER — DEXTROSE-SODIUM CHLORIDE 5-0.45 % IV SOLN: INTRAVENOUS | Status: DC

## 2023-07-21 MED ORDER — MELATONIN 3 MG PO TABS: 3.0000 mg | ORAL_TABLET | Freq: Every evening | ORAL | Status: DC | PRN

## 2023-07-21 MED ORDER — ONDANSETRON HCL 4 MG/2ML IJ SOLN: 4.0000 mg | Freq: Four times a day (QID) | INTRAMUSCULAR | Status: DC | PRN

## 2023-07-21 MED ORDER — MIDAZOLAM HCL 2 MG/2ML IJ SOLN
INTRAMUSCULAR | Status: DC | PRN
  Administered 2023-07-21: 2 mg via INTRAVENOUS

## 2023-07-21 MED ORDER — BUPIVACAINE LIPOSOME 1.3 % IJ SUSP
INTRAMUSCULAR | Status: AC
  Filled 2023-07-21: qty 20

## 2023-07-21 MED ORDER — ROCURONIUM BROMIDE 10 MG/ML (PF) SYRINGE
PREFILLED_SYRINGE | INTRAVENOUS | Status: DC | PRN
  Administered 2023-07-21: 80 mg via INTRAVENOUS
  Administered 2023-07-21: 50 mg via INTRAVENOUS

## 2023-07-21 MED ORDER — ROCURONIUM BROMIDE 10 MG/ML (PF) SYRINGE
PREFILLED_SYRINGE | INTRAVENOUS | Status: AC
  Filled 2023-07-21: qty 10

## 2023-07-21 MED ORDER — ENSURE PRE-SURGERY PO LIQD: 296.0000 mL | Freq: Once | ORAL | Status: DC

## 2023-07-21 MED ORDER — ENOXAPARIN SODIUM 40 MG/0.4ML IJ SOSY
40.0000 mg | PREFILLED_SYRINGE | INTRAMUSCULAR | Status: DC
  Administered 2023-07-22 – 2023-07-24 (×3): 40 mg via SUBCUTANEOUS
  Filled 2023-07-21 (×4): qty 0.4

## 2023-07-21 MED ORDER — CHLORHEXIDINE GLUCONATE CLOTH 2 % EX PADS
6.0000 | MEDICATED_PAD | Freq: Every day | CUTANEOUS | Status: DC
  Administered 2023-07-21 – 2023-07-22 (×2): 6 via TOPICAL

## 2023-07-21 MED ORDER — CHLORHEXIDINE GLUCONATE 0.12 % MT SOLN
OROMUCOSAL | Status: AC
  Administered 2023-07-21: 15 mL via OROMUCOSAL
  Filled 2023-07-21: qty 15

## 2023-07-21 MED ORDER — HYDROMORPHONE HCL 1 MG/ML IJ SOLN
0.2500 mg | INTRAMUSCULAR | Status: DC | PRN
  Administered 2023-07-21 (×3): 0.5 mg via INTRAVENOUS

## 2023-07-21 MED ORDER — KETAMINE HCL 10 MG/ML IJ SOLN
INTRAMUSCULAR | Status: DC | PRN
  Administered 2023-07-21 (×2): 25 mg via INTRAVENOUS

## 2023-07-21 MED ORDER — CEFAZOLIN SODIUM-DEXTROSE 2-4 GM/100ML-% IV SOLN
2.0000 g | INTRAVENOUS | Status: AC
  Administered 2023-07-21: 2 g via INTRAVENOUS

## 2023-07-21 MED ORDER — BUPIVACAINE-EPINEPHRINE (PF) 0.25% -1:200000 IJ SOLN
INTRAMUSCULAR | Status: AC
  Filled 2023-07-21: qty 30

## 2023-07-21 SURGICAL SUPPLY — 117 items
APPLICATOR ARISTA FLEXITIP XL (MISCELLANEOUS) ×1 IMPLANT
APPLIER CLIP 11 MED OPEN (CLIP)
APPLIER CLIP 13 LRG OPEN (CLIP)
APPLIER CLIP 5 13 M/L LIGAMAX5 (MISCELLANEOUS)
BAG BILE T-TUBES STRL (MISCELLANEOUS) IMPLANT
BAG COUNTER SPONGE SURGICOUNT (BAG) ×2 IMPLANT
BIOPATCH RED 1 DISK 7.0 (GAUZE/BANDAGES/DRESSINGS) ×2 IMPLANT
BLADE CLIPPER SURG (BLADE) IMPLANT
BLADE SURG 10 STRL SS (BLADE) ×2 IMPLANT
CANISTER SUCT 3000ML PPV (MISCELLANEOUS) ×2 IMPLANT
CHLORAPREP W/TINT 26 (MISCELLANEOUS) ×2 IMPLANT
CLIP APPLIE 11 MED OPEN (CLIP) IMPLANT
CLIP APPLIE 13 LRG OPEN (CLIP) IMPLANT
CLIP APPLIE 5 13 M/L LIGAMAX5 (MISCELLANEOUS) IMPLANT
CLIP LIGATING HEMO LOK XL GOLD (MISCELLANEOUS) IMPLANT
CLIP LIGATING HEMO O LOK GREEN (MISCELLANEOUS) ×3 IMPLANT
CLIP TI MEDIUM 24 (CLIP) IMPLANT
CLIP TI WIDE RED SMALL 24 (CLIP) IMPLANT
COVER SURGICAL LIGHT HANDLE (MISCELLANEOUS) ×2 IMPLANT
DEFOGGER SCOPE WARMER CLEARIFY (MISCELLANEOUS) IMPLANT
DERMABOND ADVANCED .7 DNX12 (GAUZE/BANDAGES/DRESSINGS) ×2 IMPLANT
DRAIN CHANNEL 19F RND (DRAIN) ×2 IMPLANT
DRAPE INCISE IOBAN 66X45 STRL (DRAPES) ×2 IMPLANT
DRAPE LAPAROSCOPIC ABDOMINAL (DRAPES) ×2 IMPLANT
DRAPE WARM FLUID 44X44 (DRAPES) ×2 IMPLANT
DRSG TEGADERM 4X4.5 CHG (GAUZE/BANDAGES/DRESSINGS) ×2 IMPLANT
DRSG TEGADERM 4X4.75 (GAUZE/BANDAGES/DRESSINGS) ×1 IMPLANT
ELECT BLADE 6.5 EXT (BLADE) ×1 IMPLANT
ELECT CAUTERY BLADE 6.4 (BLADE) ×1 IMPLANT
ELECT REM PT RETURN 9FT ADLT (ELECTROSURGICAL) ×2
ELECTRODE REM PT RTRN 9FT ADLT (ELECTROSURGICAL) ×1 IMPLANT
EVACUATOR SILICONE 100CC (DRAIN) IMPLANT
GAUZE SPONGE 2X2 STRL 8-PLY (GAUZE/BANDAGES/DRESSINGS) ×1 IMPLANT
GAUZE SPONGE 4X4 12PLY STRL (GAUZE/BANDAGES/DRESSINGS) ×2 IMPLANT
GEL PDS (MISCELLANEOUS) IMPLANT
GLOVE BIO SURGEON STRL SZ 6 (GLOVE) ×2 IMPLANT
GLOVE BIO SURGEON STRL SZ7 (GLOVE) ×1 IMPLANT
GLOVE BIOGEL PI IND STRL 6 (GLOVE) ×1 IMPLANT
GLOVE BIOGEL PI IND STRL 8 (GLOVE) ×1 IMPLANT
GLOVE BIOGEL PI MICRO STRL 6.5 (GLOVE) ×2 IMPLANT
GLOVE INDICATOR 6.5 STRL GRN (GLOVE) ×1 IMPLANT
GLOVE SURG POLY MICRO LF SZ5.5 (GLOVE) ×2 IMPLANT
GLOVE SURG UNDER POLY LF SZ6 (GLOVE) ×4 IMPLANT
GOWN STRL REUS W/ TWL LRG LVL3 (GOWN DISPOSABLE) ×6 IMPLANT
GOWN STRL REUS W/ TWL XL LVL3 (GOWN DISPOSABLE) ×2 IMPLANT
HANDLE SUCTION POOLE (INSTRUMENTS) ×1 IMPLANT
HEMOSTAT ARISTA ABSORB 3G PWDR (HEMOSTASIS) ×1 IMPLANT
HEMOSTAT SNOW SURGICEL 2X4 (HEMOSTASIS) ×2 IMPLANT
IRRIG SUCT STRYKERFLOW 2 WTIP (MISCELLANEOUS) ×2
IRRIGATION SUCT STRKRFLW 2 WTP (MISCELLANEOUS) ×1 IMPLANT
KIT BASIN OR (CUSTOM PROCEDURE TRAY) ×2 IMPLANT
KIT TURNOVER KIT B (KITS) ×2 IMPLANT
L-HOOK LAP DISP 36CM (ELECTROSURGICAL) ×2
LHOOK LAP DISP 36CM (ELECTROSURGICAL) IMPLANT
LIGASURE IMPACT 36 18CM CVD LR (INSTRUMENTS) ×1 IMPLANT
NDL 22X1.5 STRL (OR ONLY) (MISCELLANEOUS) ×1 IMPLANT
NDL INSUFFLATION 14GA 120MM (NEEDLE) ×1 IMPLANT
NEEDLE 22X1.5 STRL (OR ONLY) (MISCELLANEOUS) ×2
NEEDLE INSUFFLATION 14GA 120MM (NEEDLE) ×2
NS IRRIG 1000ML POUR BTL (IV SOLUTION) ×4 IMPLANT
PAD ARMBOARD 7.5X6 YLW CONV (MISCELLANEOUS) ×4 IMPLANT
PENCIL SMOKE EVACUATOR (MISCELLANEOUS) ×2 IMPLANT
POUCH LAPAROSCOPIC INSTRUMENT (MISCELLANEOUS) ×2 IMPLANT
RELOAD STAPLE 60 2.6 WHT THN (STAPLE) IMPLANT
RELOAD STAPLE 60 4.1 GRN THCK (STAPLE) IMPLANT
SCISSORS LAP 5X35 DISP (ENDOMECHANICALS) ×1 IMPLANT
SET TUBE SMOKE EVAC HIGH FLOW (TUBING) ×2 IMPLANT
SHEARS HARMONIC ACE PLUS 36CM (ENDOMECHANICALS) ×2 IMPLANT
SLEEVE Z-THREAD 5X100MM (TROCAR) ×6 IMPLANT
SPECIMEN JAR X LARGE (MISCELLANEOUS) ×2 IMPLANT
SPONGE INTESTINAL PEANUT (DISPOSABLE) IMPLANT
SPONGE SURGIFOAM ABS GEL 100 (HEMOSTASIS) IMPLANT
SPONGE T-LAP 18X18 ~~LOC~~+RFID (SPONGE) IMPLANT
STAPLE ECHEON FLEX 60 POW ENDO (STAPLE) ×2 IMPLANT
STAPLE LINE REINFORCEMENT LAP (STAPLE) ×2 IMPLANT
STAPLER RELOAD GREEN 60MM (STAPLE) ×4
STAPLER RELOAD WHITE 60MM (STAPLE) ×2
STAPLER VISISTAT 35W (STAPLE) ×2 IMPLANT
SUCTION POOLE HANDLE (INSTRUMENTS) ×2
SUT ETHILON 2 0 FS 18 (SUTURE) ×1 IMPLANT
SUT MNCRL AB 4-0 PS2 18 (SUTURE) ×2 IMPLANT
SUT NOVA NAB DX-16 0-1 5-0 T12 (SUTURE) IMPLANT
SUT PDS AB 1 CTX 36 (SUTURE) IMPLANT
SUT PDS AB 1 TP1 96 (SUTURE) ×2 IMPLANT
SUT PDS AB 3-0 SH 27 (SUTURE) IMPLANT
SUT PDS II 0 TP-1 LOOPED 60 (SUTURE) IMPLANT
SUT PROLENE 2 0 SH 30 (SUTURE) ×1 IMPLANT
SUT PROLENE 3 0 SH 1 (SUTURE) ×1 IMPLANT
SUT PROLENE 3 0 SH 48 (SUTURE) IMPLANT
SUT PROLENE 4 0 SH DA (SUTURE) IMPLANT
SUT PROLENE 4-0 RB1 .5 CRCL 36 (SUTURE) IMPLANT
SUT PROLENE 5 0 C1 (SUTURE) IMPLANT
SUT SILK 0 TIES 10X30 (SUTURE) ×2 IMPLANT
SUT SILK 2 0 SH (SUTURE) ×2 IMPLANT
SUT SILK 2 0 TIES 10X30 (SUTURE) ×1 IMPLANT
SUT SILK 2 0SH CR/8 30 (SUTURE) ×2 IMPLANT
SUT SILK 3 0 TIES 10X30 (SUTURE) IMPLANT
SUT SILK 3 0SH CR/8 30 (SUTURE) ×1 IMPLANT
SUT VIC AB 2-0 CT1 TAPERPNT 27 (SUTURE) ×1 IMPLANT
SUT VIC AB 3-0 SH 18 (SUTURE) ×1 IMPLANT
SUT VIC AB 3-0 SH 27X BRD (SUTURE) ×2 IMPLANT
SUT VICRYL 0 AB UR-6 (SUTURE) IMPLANT
SYS BAG RETRIEVAL 10MM (BASKET) ×2
SYS LAPSCP GELPORT 120MM (MISCELLANEOUS)
SYSTEM BAG RETRIEVAL 10MM (BASKET) IMPLANT
SYSTEM LAPSCP GELPORT 120MM (MISCELLANEOUS) ×1 IMPLANT
TOWEL GREEN STERILE (TOWEL DISPOSABLE) ×2 IMPLANT
TOWEL GREEN STERILE FF (TOWEL DISPOSABLE) ×2 IMPLANT
TRAY FOLEY MTR SLVR 14FR STAT (SET/KITS/TRAYS/PACK) ×2 IMPLANT
TRAY LAPAROSCOPIC MC (CUSTOM PROCEDURE TRAY) ×2 IMPLANT
TROCAR BALLN 12MMX100 BLUNT (TROCAR) IMPLANT
TROCAR Z THREAD OPTICAL 12X100 (TROCAR) ×2 IMPLANT
TROCAR Z-THREAD OPTICAL 5X100M (TROCAR) ×2 IMPLANT
TUBE CONNECTING 12X1/4 (SUCTIONS) IMPLANT
VASCULAR TIE MINI RED 18IN STL (MISCELLANEOUS) ×2 IMPLANT
WARMER LAPAROSCOPE (MISCELLANEOUS) ×2 IMPLANT
YANKAUER SUCT BULB TIP NO VENT (SUCTIONS) IMPLANT

## 2023-07-21 NOTE — Anesthesia Procedure Notes (Signed)
 Procedure Name: Intubation Date/Time: 07/21/2023 7:38 AM  Performed by: Hedy Jarred, CRNAPre-anesthesia Checklist: Patient identified, Emergency Drugs available, Suction available and Patient being monitored Patient Re-evaluated:Patient Re-evaluated prior to induction Oxygen Delivery Method: Circle System Utilized Preoxygenation: Pre-oxygenation with 100% oxygen Induction Type: IV induction Ventilation: Mask ventilation without difficulty Laryngoscope Size: Miller and 3 Grade View: Grade II Tube type: Oral Tube size: 8.0 mm Number of attempts: 1 Airway Equipment and Method: Stylet and Oral airway Placement Confirmation: ETT inserted through vocal cords under direct vision, positive ETCO2 and breath sounds checked- equal and bilateral Secured at: 23 cm Tube secured with: Tape Dental Injury: Teeth and Oropharynx as per pre-operative assessment

## 2023-07-21 NOTE — H&P (Signed)
 Jesus Hamilton is an 53 y.o. male.   Chief Complaint: pancreatic cyst HPI: Jesus Hamilton is a 53 y.o. male with a cyst in the tail of the pancreas. He was first seen in July 2022 for a 3.8cm cyst in the tail of the pancreas. He was getting ready to have surgery for prostate cancer, thus interval follow up in 3 months was planned, with likely EUS referral. However he was lost to further follow up. He more recently saw his gastroenterologist and had an MRI on 6/1 of this year. This showed a stable 3.6cm cyst with thick walls and septations. He was referred for an EUS of the cyst. This was done on 8/15 by Dr. Wilhelmenia, and showed a 3.6cm cystic mass with septations in the tail of the pancreas. There were no solid nodules. FNA was performed and fluid studies were as follows:  CEA 2880 Amylase 51 Glucose 7.8  PancraGEN molecular analysis: Elevated DNA quantity, good quality; no GNA, KRAS, or LOH mutations detected. Interpreted as statistically higher risk (65% probability of HGD/carcinoma) due to elevated cyst fluid CEA and elevated cyst fluid DNA.   He was referred to discuss possible surgery. He denies abdominal pain or changes in appetite. He has never had pancreatitis. He has no known family history of pancreatic cancer. His only prior abdominal surgery is a robotic assisted radical prostatectomy in 2022. He does not take any blood thinners.    Past Medical History:  Diagnosis Date   COVID-19 03/15/2021   GERD (gastroesophageal reflux disease)    Hyperlipidemia    Hypertension    Prostate cancer [facility])     Past Surgical History:  Procedure Laterality Date   BIOPSY  03/02/2023   Procedure: BIOPSY;  Surgeon: Wilhelmenia Aloha Raddle., MD;  Location: [facility];  Service: Gastroenterology;;   ESOPHAGOGASTRODUODENOSCOPY N/A 03/02/2023   Procedure: ESOPHAGOGASTRODUODENOSCOPY (EGD);  Surgeon: Wilhelmenia Aloha Raddle., MD;  Location: [facility];  Service: Gastroenterology;  Laterality: N/A;   EUS  N/A 03/02/2023   Procedure: UPPER ENDOSCOPIC ULTRASOUND (EUS) RADIAL;  Surgeon: Wilhelmenia Aloha Raddle., MD;  Location: [facility];  Service: Gastroenterology;  Laterality: N/A;   FINE NEEDLE ASPIRATION N/A 03/02/2023   Procedure: FINE NEEDLE ASPIRATION (FNA) LINEAR;  Surgeon: Wilhelmenia Aloha Raddle., MD;  Location: [facility];  Service: Gastroenterology;  Laterality: N/A;   HAND SURGERY Right    PELVIC LYMPH NODE DISSECTION Bilateral 04/21/2021   Procedure: BILATERAL PELVIC LYMPH NODE DISSECTION;  Surgeon: Cam Morene ORN, MD;  Location: [facility];  Service: Urology;  Laterality: Bilateral;   PROSTATE BIOPSY     ROBOT ASSISTED LAPAROSCOPIC RADICAL PROSTATECTOMY N/A 04/21/2021   Procedure: XI ROBOTIC ASSISTED LAPAROSCOPIC RADICAL PROSTATECTOMY;  Surgeon: Cam Morene ORN, MD;  Location: [facility];  Service: Urology;  Laterality: N/A;    Family History  Problem Relation Age of Onset   Colon cancer Neg Hx    Colon polyps Neg Hx    Esophageal cancer Neg Hx    Prostate cancer Neg Hx    Rectal cancer Neg Hx    Social History:  reports that he has never smoked. He has never used smokeless tobacco. He reports current alcohol use. He reports that he does not use drugs.  Allergies: No Known Allergies  Medications Prior to Admission  Medication Sig Dispense Refill   atorvastatin  (LIPITOR) 10 MG tablet Take 1 tablet by mouth once daily 90 tablet 0   lisinopril -hydrochlorothiazide  (ZESTORETIC ) 10-12.5 MG tablet Take 1 tablet by mouth once daily 90 tablet  0    Results for orders placed or performed during the hospital encounter of 07/21/23 (from the past 48 hours)  Prepare RBC (crossmatch)     Status: None   Collection Time: 07/21/23  6:30 AM  Result Value Ref Range   Order Confirmation      ORDER PROCESSED BY BLOOD BANK Performed at [facility], [address] [place], [place] [address]    No results found.  Review of Systems  Blood pressure 124/82, pulse 69, temperature 98.7  F (37.1 C), resp. rate 18, height 6' (1.829 m), weight 105.2 kg, SpO2 100%. Physical Exam Vitals reviewed.  Constitutional:      General: He is not in acute distress.    Appearance: Normal appearance.  HENT:     Head: Normocephalic and atraumatic.  Pulmonary:     Effort: Pulmonary effort is normal. No respiratory distress.  Abdominal:     General: There is no distension.     Palpations: Abdomen is soft.     Tenderness: There is no abdominal tenderness.  Skin:    General: Skin is warm and dry.  Neurological:     General: No focal deficit present.     Mental Status: He is alert and oriented to person, place, and time.      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 Proceed to the OR for a laparoscopic distal pancreatectomy, possible splenectomy. The procedure details were reviewed and informed consent obtained. Admit to inpatient postoperatively. Type and screen completed.  Leonor LITTIE Dawn, MD 07/21/2023, 7:16 AM

## 2023-07-21 NOTE — Anesthesia Procedure Notes (Signed)
 Arterial Line Insertion Start/End1/09/2023 7:00 AM, 07/21/2023 7:05 AM Performed by: Zelphia Norleen HERO, CRNA, CRNA  Patient location: Pre-op. Preanesthetic checklist: patient identified, IV checked, site marked, risks and benefits discussed, surgical consent, monitors and equipment checked, pre-op evaluation, timeout performed and anesthesia consent Lidocaine  1% used for infiltration Right, radial was placed Catheter size: 20 G Hand hygiene performed , maximum sterile barriers used  and Seldinger technique used Allen's test indicative of satisfactory collateral circulation Attempts: 1 Procedure performed without using ultrasound guided technique. Following insertion, dressing applied. Post procedure assessment: normal and unchanged  Patient tolerated the procedure well with no immediate complications.

## 2023-07-21 NOTE — Op Note (Addendum)
 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 is a 53 yo male who was diagnosed with prostate cancer several years ago, for which he underwent surgery. During his workup he was found to have a cyst in the tail of the pancreas. He recently underwent an EUS with FNA. Fluid analysis was consistent with a mucinous cystic neoplasm. After a discussion of the risks and benefits of surgery, he agreed to proceed with resection.  Findings: Well-circumscribed cystic mass in the tail of the pancreas adjacent to the spleen. The spleen, splenic vessels, and short gastric vessels were preserved.  Procedure details: Informed consent was obtained in the preoperative area prior to the procedure. The patient was brought to the operating room and placed on the table in the supine position. General anesthesia was induced and appropriate lines and drains were placed for intraoperative monitoring. Perioperative antibiotics were administered per SCIP guidelines. The abdomen was prepped and draped in the usual sterile fashion. A pre-procedure timeout was taken verifying patient identity, surgical site and procedure to be performed.  A small skin incision was made in the left upper quadrant. The fascia was grasped and elevated, a Veress needle was inserted through the fascia, and intraperitoneal placement was confirmed with a saline drop test.  The abdomen was insufflated and a 5 mm Visiport was placed.  The abdomen was inspected with no evidence of visceral or vascular injury.  Additional 5 mm ports were placed in the  supraumbilical position, the left lateral subcostal margin, and in the epigastric area.  The gastrocolic omentum was elevated and opened with harmonic shears along the mid gastric body.  This dissection was carried superiorly along the greater curve of the stomach to open the lesser sac.  The short gastric vessels were not divided.  The splenic flexure of the colon was partially mobilized to completely expose the tail of the pancreas.  A Nathanson retractor was then placed in the subxiphoid position and used to elevate the stomach and the left lateral liver off the pancreas.  A cystic mass was present in the tail of the pancreas adjacent to the spleen.  The inferior border of the pancreas was identified and dissected out using harmonic shears.  The posterior border of the tail of the pancreas was dissected off the retroperitoneum using blunt dissection and harmonic shears.  The splenic vein was then identified on the posterior side of the pancreas.  A window was then created between the splenic vein and the pancreatic parenchyma using blunt dissection.  The epigastric port was upsized to a 12 mm port.  An intraoperative ultrasound was then performed, confirming the location of the cyst and ensuring an adequate margin of resection.  The splenic artery was identified just superior to the splenic vein.  A 60 mm powered stapler with a green load reinforced with peristrips was used to divide the pancreas, preserving the splenic vessels.  An additional green staple load with Peri-Strips was needed to completely divide the pancreatic parenchyma.  Next the tail the pancreas and the cyst were carefully dissected off of the splenic vessels using gentle blunt dissection.  Visible branch vessels were clipped with Hem-o-lok clips prior to division.  There  was some bleeding from a branch of the splenic vein, which was controlled with clip placement.  The cyst was then dissected off of the spleen using harmonic shears.  The cyst  was well encapsulated with a clear plane between the cyst and the spleen.  Once the specimen was completely freed, it was placed in an Endo Catch bag and extracted via the supraumbilical port site.  The specimen was examined and the cyst was completely intact with a gross margin of normal pancreatic tissue between the specimen and the staple line.  The staple line had become separated from the specimen, and this was sent separately to pathology as the proximal pancreatic margin.  The surgical site was irrigated and there was a small amount of oozing from the splenic parenchyma at the hilum.  A piece of Surgicel snow was applied and manual pressure was held for several minutes, which resulted in hemostasis.  Arista powder was also applied to the surgical field.  The site appeared hemostatic.  A 19 French JP drain was placed adjacent to the remnant pancreas and brought out through the left lateral-most port site.  The drain was secured to the skin with a 2-0 nylon suture.  The Nathanson retractor was removed and the stomach was placed back in proper anatomic position.  The stomach was examined and had no signs of injuries.  The splenic flexure of the colon was pink and well-perfused with no signs of injury.  The remaining ports were removed and the abdomen was desufflated.  The fascia at the 12 mm port site was closed with 0 Vicryl figure-of-eight sutures, and Scarpa's layer was closed with a 3-0 Vicryl suture.  The skin at all port sites was closed with 4-0 Monocryl subcuticular suture.  Dermabond was applied.   Intraoperative US  of pancreatic cyst  The patient tolerated the procedure well with no apparent complications.  All counts were correct x2 at the end of the procedure. The patient was extubated and taken to PACU in stable condition.  Leonor Dawn, MD 07/21/23 10:59 AM

## 2023-07-21 NOTE — Transfer of Care (Signed)
 Immediate Anesthesia Transfer of Care Note  Patient: Jesus Hamilton  Procedure(s) Performed: LAPAROSCOPIC DISTAL PANCREATECTOMY (Abdomen)  Patient Location: PACU  Anesthesia Type:General  Level of Consciousness: drowsy  Airway & Oxygen Therapy: Patient Spontanous Breathing and Patient connected to face mask oxygen  Post-op Assessment: Report given to RN and Post -op Vital signs reviewed and stable  Post vital signs: Reviewed and stable  Last Vitals:  Vitals Value Taken Time  BP 161/88 07/21/23 1108  Temp 36.4 C 07/21/23 1105  Pulse 96 07/21/23 1113  Resp 30 07/21/23 1113  SpO2 98 % 07/21/23 1113  Vitals shown include unfiled device data.  Last Pain:  Vitals:   07/21/23 1105  PainSc: Asleep         Complications: No notable events documented.

## 2023-07-21 NOTE — Anesthesia Postprocedure Evaluation (Signed)
 Anesthesia Post Note  Patient: MERION GRIMALDO  Procedure(s) Performed: LAPAROSCOPIC DISTAL PANCREATECTOMY (Abdomen)     Patient location during evaluation: PACU Anesthesia Type: General Level of consciousness: awake and alert and oriented Pain management: pain level controlled Vital Signs Assessment: post-procedure vital signs reviewed and stable Respiratory status: spontaneous breathing, nonlabored ventilation and respiratory function stable Cardiovascular status: blood pressure returned to baseline and stable Postop Assessment: no apparent nausea or vomiting Anesthetic complications: no   No notable events documented.  Last Vitals:  Vitals:   07/21/23 1215 07/21/23 1230  BP: (!) 149/88 (!) 147/87  Pulse: 91 91  Resp: (!) 22 19  Temp:    SpO2: 92% 92%    Last Pain:  Vitals:   07/21/23 1230  PainSc: Asleep                 Davidlee Jeanbaptiste A.

## 2023-07-21 NOTE — Addendum Note (Signed)
 Addendum  created 07/21/23 1523 by Camillia Herter, CRNA   Flowsheet accepted, Intraprocedure Flowsheets edited

## 2023-07-21 NOTE — Progress Notes (Signed)
 Pt arrived to 6  north room 32 alert and oriented x4. Pain level 5/10. 3 port sites with skin glue. All clean dry and intact. Jp drain on left side in charge position. Bed in lowest position. Call light in reach. Will continue to monitor pt.

## 2023-07-22 ENCOUNTER — Encounter (HOSPITAL_COMMUNITY): Payer: Self-pay | Admitting: Surgery

## 2023-07-22 LAB — CBC
HCT: 36 % — ABNORMAL LOW (ref 39.0–52.0)
Hemoglobin: 11.8 g/dL — ABNORMAL LOW (ref 13.0–17.0)
MCH: 28.5 pg (ref 26.0–34.0)
MCHC: 32.8 g/dL (ref 30.0–36.0)
MCV: 87 fL (ref 80.0–100.0)
Platelets: 277 10*3/uL (ref 150–400)
RBC: 4.14 MIL/uL — ABNORMAL LOW (ref 4.22–5.81)
RDW: 13.9 % (ref 11.5–15.5)
WBC: 13.9 10*3/uL — ABNORMAL HIGH (ref 4.0–10.5)
nRBC: 0 % (ref 0.0–0.2)

## 2023-07-22 LAB — BASIC METABOLIC PANEL
Anion gap: 7 (ref 5–15)
BUN: 8 mg/dL (ref 6–20)
CO2: 26 mmol/L (ref 22–32)
Calcium: 8.3 mg/dL — ABNORMAL LOW (ref 8.9–10.3)
Chloride: 106 mmol/L (ref 98–111)
Creatinine, Ser: 0.76 mg/dL (ref 0.61–1.24)
GFR, Estimated: >60 mL/min (ref >60)
Glucose, Bld: 117 mg/dL — ABNORMAL HIGH (ref 70–99)
Potassium: 3.6 mmol/L (ref 3.5–5.1)
Sodium: 139 mmol/L (ref 135–145)

## 2023-07-22 NOTE — Evaluation (Signed)
 Physical Therapy Evaluation Patient Details Name: Jesus Hamilton MRN: [id number] DOB: May 01, 1971 Today's Date: 07/22/2023  History of Present Illness  Pt is a 53 y/o male admitted 1/3 for laparoscopic distal pancreatectomy for known mucinous cystic neoplasm on the tail of the pancrease.  PMHx  HTN, prostate CA  Clinical Impression  Pt is at or close to baseline functioning and should be safe at home with PRN assist. There are no further acute PT needs.  Will sign off at this time.         If plan is discharge home, recommend the following:  (PRN assist)   Can travel by private vehicle        Equipment Recommendations  None  Recommendations for Other Services    No follow up   Functional Status Assessment       Precautions / Restrictions Precautions Precautions: None      Mobility  Bed Mobility Overal bed mobility: Modified Independent                  Transfers Overall transfer level: Modified independent                      Ambulation/Gait Ambulation/Gait assistance: Modified independent (Device/Increase time) Gait Distance (Feet): 400 Feet Assistive device: None Gait Pattern/deviations: Step-through pattern   Gait velocity interpretation: >2.62 ft/sec, indicative of community ambulatory   General Gait Details: slower, but stable gait.  No deviation with basic balance challenge.  Stairs Stairs: Yes Stairs assistance: Modified independent (Device/Increase time) Stair Management: One rail Right, Forwards Number of Stairs: 12    Wheelchair Mobility     Tilt Bed    Modified Rankin (Stroke Patients Only)       Balance                                             Pertinent Vitals/Pain Pain Assessment Pain Assessment: Faces Faces Pain Scale: Hurts little more Pain Location: abdominal incision Pain Descriptors / Indicators: Sore Pain Intervention(s): Monitored during session    Home Living                           Prior Function                       Extremity/Trunk Assessment                Communication      Cognition Arousal: Alert Behavior During Therapy: WFL for tasks assessed/performed Overall Cognitive Status: Within Functional Limits for tasks assessed                                          General Comments General comments (skin integrity, edema, etc.): vss    Exercises     Assessment/Plan    PT Assessment    PT Problem List         PT Treatment Interventions      PT Goals (Current goals can be found in the Care Plan section)  Acute Rehab PT Goals Patient Stated Goal: Independent and back to work    Frequency       Co-evaluation  AM-PAC PT 6 Clicks Mobility  Outcome Measure Help needed turning from your back to your side while in a flat bed without using bedrails?: None Help needed moving from lying on your back to sitting on the side of a flat bed without using bedrails?: None Help needed moving to and from a bed to a chair (including a wheelchair)?: None Help needed standing up from a chair using your arms (e.g., wheelchair or bedside chair)?: None Help needed to walk in hospital room?: None Help needed climbing 3-5 steps with a railing? : None 6 Click Score: 24    End of Session   Activity Tolerance: Patient tolerated treatment well Patient left: in bed;with call bell/phone within reach;with family/visitor present Nurse Communication: Mobility status PT Visit Diagnosis: Pain Pain - part of body:  (abdomen)    Time: 1415-1430 PT Time Calculation (min) (ACUTE ONLY): 15 min   Charges:   PT Evaluation $PT Eval Low Complexity: 1 Low   PT General Charges $$ ACUTE PT VISIT: 1 Visit         07/22/2023  [place] HERO., PT Acute Rehabilitation Services [phone]  (office)  Vinie GAILS Ziere Docken 07/22/2023, 2:49 PM

## 2023-07-22 NOTE — Progress Notes (Signed)
 Progress Note  1 Day Post-Op  Subjective: Patient reports LUQ abdominal pain. Ambulated in hall already this AM. Belching but no flatus. Denies nausea. Good UOP from foley.   Objective: Vital signs in last 24 hours: Temp:  [97.5 F (36.4 C)-99.2 F (37.3 C)] 99.2 F (37.3 C) (01/04 0500) Pulse Rate:  [79-100] 80 (01/04 0500) Resp:  [16-28] 18 (01/04 0500) BP: (117-167)/(68-112) 126/71 (01/04 0500) SpO2:  [92 %-100 %] 96 % (01/04 0500) Arterial Line BP: (134-158)/(75-104) 155/75 (01/03 1200)    Intake/Output from previous day: 01/03 0701 - 01/04 0700 In: 5395.7 [P.O.:1320; I.V.:3575.7; IV Piggyback:500] Out: 3890 [Urine:3150; Drains:140; Blood:600] Intake/Output this shift: No intake/output data recorded.  PE: General: pleasant, WD, WN male who is laying in bed in NAD Lungs:Respiratory effort nonlabored Abd: soft, appropriately ttp, mild distention, incisions C/D/I, drain with dark bloody fluid small amount  GU: foley present with straw colored urine  Psych: A&Ox3 with an appropriate affect.    Lab Results:  Recent Labs    07/22/23 0529  WBC 13.9*  HGB 11.8*  HCT 36.0*  PLT 277   BMET Recent Labs    07/22/23 0529  NA 139  K 3.6  CL 106  CO2 26  GLUCOSE 117*  BUN 8  CREATININE 0.76  CALCIUM  8.3*   PT/INR No results for input(s): LABPROT, INR in the last 72 hours. CMP     Component Value Date/Time   NA 139 07/22/2023 0529   NA 142 07/07/2023 0840   K 3.6 07/22/2023 0529   CL 106 07/22/2023 0529   CO2 26 07/22/2023 0529   GLUCOSE 117 (H) 07/22/2023 0529   BUN 8 07/22/2023 0529   BUN 15 07/07/2023 0840   CREATININE 0.76 07/22/2023 0529   CALCIUM  8.3 (L) 07/22/2023 0529   PROT 6.9 07/07/2023 0840   ALBUMIN  4.2 07/07/2023 0840   AST 19 07/07/2023 0840   ALT 19 07/07/2023 0840   ALKPHOS 59 07/07/2023 0840   BILITOT 0.6 07/07/2023 0840   GFRNONAA >60 07/22/2023 0529   Lipase  No results found for: LIPASE     Studies/Results: No  results found.  Anti-infectives: Anti-infectives (From admission, onward)    Start     Dose/Rate Route Frequency Ordered Stop   07/21/23 0600  ceFAZolin  (ANCEF ) IVPB 2g/100 mL premix       Placed in And Linked Group   2 g 200 mL/hr over 30 Minutes Intravenous On call to O.R. 07/21/23 0555 07/21/23 0741   07/21/23 0600  metroNIDAZOLE  (FLAGYL ) IVPB 500 mg       Placed in And Linked Group   500 mg 100 mL/hr over 60 Minutes Intravenous On call to O.R. 07/21/23 0555 07/21/23 0751   07/21/23 0557  metroNIDAZOLE  (FLAGYL ) 500 MG/100ML IVPB       Note to Pharmacy: Effie Rily O: cabinet override      07/21/23 0557 07/21/23 0814   07/21/23 0557  ceFAZolin  (ANCEF ) 2-4 GM/100ML-% IVPB       Note to Pharmacy: Effie Rily O: cabinet override      07/21/23 0557 07/21/23 0814        Assessment/Plan Mucinous cystic neoplasm of the pancreas  POD1 laparoscopic distal pancreatectomy with spleen preservation Dr. Dasie  - UOP good - DC foley - encourage mobilization  - mild ileus, continue CLD for now until having more bowel function  - send drain amylase today  - work on PO pain control - encouraged patient to utilize prn oxycodone  if  needed   FEN: CLD, SLIV VTE: LMWH ID: ancef /flagyl  pre-op  LOS: 1 day     Jesus Hamilton, [facility] 07/22/2023, 8:18 AM Please see Amion for pager number during day hours 7:00am-4:30pm

## 2023-07-22 NOTE — Plan of Care (Signed)

## 2023-07-23 LAB — BASIC METABOLIC PANEL
Anion gap: 10 (ref 5–15)
BUN: 8 mg/dL (ref 6–20)
CO2: 25 mmol/L (ref 22–32)
Calcium: 8.6 mg/dL — ABNORMAL LOW (ref 8.9–10.3)
Chloride: 102 mmol/L (ref 98–111)
Creatinine, Ser: 0.76 mg/dL (ref 0.61–1.24)
GFR, Estimated: >60 mL/min (ref >60)
Glucose, Bld: 98 mg/dL (ref 70–99)
Potassium: 3.5 mmol/L (ref 3.5–5.1)
Sodium: 137 mmol/L (ref 135–145)

## 2023-07-23 LAB — CBC
HCT: 36.7 % — ABNORMAL LOW (ref 39.0–52.0)
Hemoglobin: 12 g/dL — ABNORMAL LOW (ref 13.0–17.0)
MCH: 28.4 pg (ref 26.0–34.0)
MCHC: 32.7 g/dL (ref 30.0–36.0)
MCV: 87 fL (ref 80.0–100.0)
Platelets: 271 10*3/uL (ref 150–400)
RBC: 4.22 MIL/uL (ref 4.22–5.81)
RDW: 14.1 % (ref 11.5–15.5)
WBC: 19.7 10*3/uL — ABNORMAL HIGH (ref 4.0–10.5)
nRBC: 0 % (ref 0.0–0.2)

## 2023-07-23 MED ORDER — METHOCARBAMOL 1000 MG/10ML IJ SOLN
1000.0000 mg | Freq: Three times a day (TID) | INTRAMUSCULAR | Status: AC
  Administered 2023-07-23 – 2023-07-24 (×3): 1000 mg via INTRAVENOUS
  Filled 2023-07-23 (×3): qty 10

## 2023-07-23 NOTE — Progress Notes (Signed)
 Progress Note  2 Days Post-Op  Subjective: Patient reports that he has more LUQ pain as the pain medication wears off. Took IV pain meds once yesterday afternoon. Denies n/v. No flatus. He has been ambulating and getting up to chair. Good UOP since foley removal.   Objective: Vital signs in last 24 hours: Temp:  [98.6 F (37 C)-99.4 F (37.4 C)] 98.9 F (37.2 C) (01/05 0408) Pulse Rate:  [98-104] 98 (01/05 0408) Resp:  [16-18] 18 (01/05 0408) BP: (130-147)/(63-79) 141/76 (01/05 0408) SpO2:  [86 %-96 %] 93 % (01/05 0408) Last BM Date : 07/21/23  Intake/Output from previous day: 01/04 0701 - 01/05 0700 In: 680 [P.O.:680] Out: 325 [Urine:325] Intake/Output this shift: Total I/O In: -  Out: 75 [Drains:75]  PE: General: pleasant, WD, WN male who is laying in bed in NAD Lungs:Respiratory effort nonlabored Abd: soft, appropriately ttp, mild distention, incisions C/D/I, drain with dark bloody fluid small amount  GU: foley present with straw colored urine  Psych: A&Ox3 with an appropriate affect.    Lab Results:  Recent Labs    07/22/23 0529 07/23/23 0646  WBC 13.9* 19.7*  HGB 11.8* 12.0*  HCT 36.0* 36.7*  PLT 277 271   BMET Recent Labs    07/22/23 0529 07/23/23 0646  NA 139 137  K 3.6 3.5  CL 106 102  CO2 26 25  GLUCOSE 117* 98  BUN 8 8  CREATININE 0.76 0.76  CALCIUM  8.3* 8.6*   PT/INR No results for input(s): LABPROT, INR in the last 72 hours. CMP     Component Value Date/Time   NA 137 07/23/2023 0646   NA 142 07/07/2023 0840   K 3.5 07/23/2023 0646   CL 102 07/23/2023 0646   CO2 25 07/23/2023 0646   GLUCOSE 98 07/23/2023 0646   BUN 8 07/23/2023 0646   BUN 15 07/07/2023 0840   CREATININE 0.76 07/23/2023 0646   CALCIUM  8.6 (L) 07/23/2023 0646   PROT 6.9 07/07/2023 0840   ALBUMIN  4.2 07/07/2023 0840   AST 19 07/07/2023 0840   ALT 19 07/07/2023 0840   ALKPHOS 59 07/07/2023 0840   BILITOT 0.6 07/07/2023 0840   GFRNONAA >60 07/23/2023 0646    Lipase  No results found for: LIPASE     Studies/Results: No results found.  Anti-infectives: Anti-infectives (From admission, onward)    Start     Dose/Rate Route Frequency Ordered Stop   07/21/23 0600  ceFAZolin  (ANCEF ) IVPB 2g/100 mL premix       Placed in And Linked Group   2 g 200 mL/hr over 30 Minutes Intravenous On call to O.R. 07/21/23 0555 07/21/23 0741   07/21/23 0600  metroNIDAZOLE  (FLAGYL ) IVPB 500 mg       Placed in And Linked Group   500 mg 100 mL/hr over 60 Minutes Intravenous On call to O.R. 07/21/23 0555 07/21/23 0751   07/21/23 0557  metroNIDAZOLE  (FLAGYL ) 500 MG/100ML IVPB       Note to Pharmacy: Effie Rily O: cabinet override      07/21/23 0557 07/21/23 0814   07/21/23 0557  ceFAZolin  (ANCEF ) 2-4 GM/100ML-% IVPB       Note to Pharmacy: Effie Rily O: cabinet override      07/21/23 0557 07/21/23 0814        Assessment/Plan Mucinous cystic neoplasm of the pancreas  POD2 laparoscopic distal pancreatectomy with spleen preservation Dr. Dasie  - UOP good s/p foley removal - encourage mobilization  - mild ileus, continue CLD  for now until having more bowel function  - drain amylase pending  - WBC 19.7 this AM, HD stable and afebrile, recheck in AM - pain control: continue scheduled tylenol , increased scheduled robaxin  to 1000 mg q8h, continue oxycodone  5-10 mg q4h prn, IV dilaudid  for breakthrough   FEN: CLD, SLIV VTE: LMWH ID: [email or web address] /[email or web address]  pre-op  LOS: 2 days     Burnard JONELLE Louder, [facility] 07/23/2023, 8:33 AM Please see Amion for pager number during day hours 7:00am-4:30pm

## 2023-07-23 NOTE — Plan of Care (Signed)
 Patient resting quietly in bed. He is able to ambulate indep to BR with c/o increased abd pain. 4 lap sites to abd remain C/D/I w/ skin adhesive intact. VS WNL for him, he remains afebrile. Denies N&V and is tolerating clear liquid diet w/o c/o. Will cont to monitor for progression to d/c to home.

## 2023-07-24 LAB — BASIC METABOLIC PANEL
Anion gap: 11 (ref 5–15)
BUN: 8 mg/dL (ref 6–20)
CO2: 24 mmol/L (ref 22–32)
Calcium: 8.4 mg/dL — ABNORMAL LOW (ref 8.9–10.3)
Chloride: 102 mmol/L (ref 98–111)
Creatinine, Ser: 0.73 mg/dL (ref 0.61–1.24)
GFR, Estimated: >60 mL/min (ref >60)
Glucose, Bld: 100 mg/dL — ABNORMAL HIGH (ref 70–99)
Potassium: 3.2 mmol/L — ABNORMAL LOW (ref 3.5–5.1)
Sodium: 137 mmol/L (ref 135–145)

## 2023-07-24 LAB — CBC
HCT: 36.4 % — ABNORMAL LOW (ref 39.0–52.0)
Hemoglobin: 12.1 g/dL — ABNORMAL LOW (ref 13.0–17.0)
MCH: 28.9 pg (ref 26.0–34.0)
MCHC: 33.2 g/dL (ref 30.0–36.0)
MCV: 87.1 fL (ref 80.0–100.0)
Platelets: 238 10*3/uL (ref 150–400)
RBC: 4.18 MIL/uL — ABNORMAL LOW (ref 4.22–5.81)
RDW: 13.6 % (ref 11.5–15.5)
WBC: 15.8 10*3/uL — ABNORMAL HIGH (ref 4.0–10.5)
nRBC: 0 % (ref 0.0–0.2)

## 2023-07-24 MED ORDER — DOCUSATE SODIUM 100 MG PO CAPS: 100.0000 mg | ORAL_CAPSULE | Freq: Two times a day (BID) | ORAL | Status: DC

## 2023-07-24 MED ORDER — POLYETHYLENE GLYCOL 3350 17 G PO PACK: 17.0000 g | PACK | Freq: Every day | ORAL | Status: DC | PRN

## 2023-07-24 MED ORDER — POTASSIUM CHLORIDE CRYS ER 20 MEQ PO TBCR
40.0000 meq | EXTENDED_RELEASE_TABLET | ORAL | Status: AC
  Administered 2023-07-24 (×2): 40 meq via ORAL
  Filled 2023-07-24 (×2): qty 2

## 2023-07-24 MED ORDER — ACETAMINOPHEN 500 MG PO TABS: 1000.0000 mg | ORAL_TABLET | Freq: Three times a day (TID) | ORAL | Status: AC | PRN

## 2023-07-24 NOTE — Discharge Instructions (Addendum)
 [facility] DISCHARGE INSTRUCTIONS  Activity No heavy lifting greater than 15 pounds for 6 weeks after surgery. Ok to shower, but do not bathe or submerge incisions underwater. Do not drive while taking narcotic pain medication. You may drive when you are no longer taking prescription pain medication, you can comfortably wear a seatbelt, and you can safely maneuver your car and apply brakes.  Wound Care Your incisions are covered with skin glue called Dermabond. This will peel off on its own over time. You may shower and allow warm soapy water  to run over your incisions. Gently pat dry. Do not submerge your incision underwater until cleared by your surgeon. Monitor your incision for any new redness, tenderness, or drainage. Many patients will experience some swelling and bruising at the incisions.  Ice packs will help.  Swelling and bruising can take several days to resolve.   [facility] DRAIN CARE INSTRUCTIONS Wash your hands prior to caring for your drain. Uncap the bulb to release the suction. Pour out the bulb contents into a measuring cup and record the amount. Squeeze the bulb and replace the cap to apply suction again. You should empty your drain each time you notice the bulb is full. Empty at least once a day and more often when the bulb fills up.  Please keep a daily log of your drain output and bring this with you when you come to clinic.  Medications A  prescription for pain medication may be given to you upon discharge.  Take your pain medication as prescribed, if needed.  If narcotic pain medicine is not needed, then you may take acetaminophen  (Tylenol ) or ibuprofen (Advil) as needed. It is common to experience some constipation if taking pain medication after surgery.  Increasing fluid intake and taking a stool softener (such as Colace) will usually help or prevent this problem from occurring.  A mild laxative (Milk of Magnesia or Miralax ) should be taken according to  package directions if there are no bowel movements after 48 hours. Take your usually prescribed medications unless otherwise directed. If you need a refill on your pain medication, please contact your pharmacy.  They will contact our office to request authorization. Prescriptions will not be filled after 5 pm or on weekends.  When to Call Us : Fever greater than 100.5 New redness, drainage, or swelling at incision site Severe pain, nausea, or vomiting Persistent bleeding from incisions Jaundice (yellowing of the whites of the eyes or skin)  Follow-up You have an appointment scheduled with Dr. Dasie on August 27, 2022 at 9:20am. This will be at the [facility] Surgery office at [address], [place], [place]. Please arrive at least 15 minutes prior to your scheduled appointment time.  IF YOU HAVE DISABILITY OR FAMILY LEAVE FORMS, YOU MUST BRING THEM TO THE OFFICE FOR PROCESSING.   DO NOT GIVE THEM TO YOUR DOCTOR.  The clinic staff is available to answer your questions during regular business hours.  Please don't hesitate to call and ask to speak to one of the nurses for clinical concerns.  If you have a medical emergency, go to the nearest emergency room or call 911.  A surgeon from [facility] is always on call at the hospital  [address], [address], [place], [place]  [address] ?  [address] [address], [place], [place]   [address] [phone] ? Toll Free: [phone] ? FAX [phone] Web site: [email or web address]      Managing Your  Pain After Surgery Without Opioids    Thank you for participating in our program to help patients manage their pain after surgery without opioids. This is part of our effort to provide you with the best care possible, without exposing you or your family to the risk that opioids pose.  What pain can I expect after surgery? You can expect to have some pain after surgery. This is normal. The pain is  typically worse the day after surgery, and quickly begins to get better. Many studies have found that many patients are able to manage their pain after surgery with Over-the-Counter (OTC) medications such as Tylenol  and Motrin. If you have a condition that does not allow you to take Tylenol  or Motrin, notify your surgical team.  How will I manage my pain? The best strategy for controlling your pain after surgery is around the clock pain control with Tylenol  (acetaminophen ) and Motrin (ibuprofen or Advil). Alternating these medications with each other allows you to maximize your pain control. In addition to Tylenol  and Motrin, you can use heating pads or ice packs on your incisions to help reduce your pain.  How will I alternate your regular strength over-the-counter pain medication? You will take a dose of pain medication every three hours. Start by taking 650 mg of Tylenol  (2 pills of 325 mg) 3 hours later take 600 mg of Motrin (3 pills of 200 mg) 3 hours after taking the Motrin take 650 mg of Tylenol  3 hours after that take 600 mg of Motrin.   - 1 -  See example - if your first dose of Tylenol  is at 12:00 PM   12:00 PM Tylenol  650 mg (2 pills of 325 mg)  3:00 PM Motrin 600 mg (3 pills of 200 mg)  6:00 PM Tylenol  650 mg (2 pills of 325 mg)  9:00 PM Motrin 600 mg (3 pills of 200 mg)  Continue alternating every 3 hours   We recommend that you follow this schedule around-the-clock for at least 3 days after surgery, or until you feel that it is no longer needed. Use the table on the last page of this handout to keep track of the medications you are taking. Important: Do not take more than 3000mg  of Tylenol  or 3200mg  of Motrin in a 24-hour period. Do not take ibuprofen/Motrin if you have a history of bleeding stomach ulcers, severe kidney disease, &/or actively taking a blood thinner  What if I still have pain? If you have pain that is not controlled with the over-the-counter pain  medications (Tylenol  and Motrin or Advil) you might have what we call "breakthrough" pain. You will receive a prescription for a small amount of an opioid pain medication such as Oxycodone , Tramadol , or Tylenol  with Codeine. Use these opioid pills in the first 24 hours after surgery if you have breakthrough pain. Do not take more than 1 pill every 4-6 hours.  If you still have uncontrolled pain after using all opioid pills, don't hesitate to call our staff using the number provided. We will help make sure you are managing your pain in the best way possible, and if necessary, we can provide a prescription for additional pain medication.   Day 1    Time  Name of Medication Number of pills taken  Amount of Acetaminophen   Pain Level   Comments  AM PM       AM PM       AM PM       AM  PM       AM PM       AM PM       AM PM       AM PM       Total Daily amount of Acetaminophen  Do not take more than  3,000 mg per day      Day 2    Time  Name of Medication Number of pills taken  Amount of Acetaminophen   Pain Level   Comments  AM PM       AM PM       AM PM       AM PM       AM PM       AM PM       AM PM       AM PM       Total Daily amount of Acetaminophen  Do not take more than  3,000 mg per day      Day 3    Time  Name of Medication Number of pills taken  Amount of Acetaminophen   Pain Level   Comments  AM PM       AM PM       AM PM       AM PM         AM PM       AM PM       AM PM       AM PM       Total Daily amount of Acetaminophen  Do not take more than  3,000 mg per day      Day 4    Time  Name of Medication Number of pills taken  Amount of Acetaminophen   Pain Level   Comments  AM PM       AM PM       AM PM       AM PM       AM PM       AM PM       AM PM       AM PM       Total Daily amount of Acetaminophen  Do not take more than  3,000 mg per day      Day 5    Time  Name of Medication Number of pills taken  Amount of Acetaminophen    Pain Level   Comments  AM PM       AM PM       AM PM       AM PM       AM PM       AM PM       AM PM       AM PM       Total Daily amount of Acetaminophen  Do not take more than  3,000 mg per day      Day 6    Time  Name of Medication Number of pills taken  Amount of Acetaminophen   Pain Level  Comments  AM PM       AM PM       AM PM       AM PM       AM PM       AM PM       AM PM       AM PM       Total Daily amount of Acetaminophen  Do not take more than  3,000 mg per  day      Day 7    Time  Name of Medication Number of pills taken  Amount of Acetaminophen   Pain Level   Comments  AM PM       AM PM       AM PM       AM PM       AM PM       AM PM       AM PM       AM PM       Total Daily amount of Acetaminophen  Do not take more than  3,000 mg per day        For additional information about how and where to safely dispose of unused opioid medications - prankcrew.uy  Disclaimer: This document contains information and/or instructional materials adapted from [facility] for the typical patient with your condition. It does not replace medical advice from your health care provider because your experience may differ from that of the typical patient. Talk to your health care provider if you have any questions about this document, your condition or your treatment plan. Adapted from [facility]

## 2023-07-24 NOTE — Progress Notes (Signed)
   Progress Note  3 Days Post-Op  Subjective: Passing flatus and feeling better. Pain controlled. WBC downtrending to 15. Afebrile. Denies nausea/vomiting.  Objective: Vital signs in last 24 hours: Temp:  [98.2 F (36.8 C)-99.7 F (37.6 C)] 98.2 F (36.8 C) (01/06 0846) Pulse Rate:  [93-108] 93 (01/06 0846) Resp:  [16-18] 17 (01/06 0846) BP: (117-132)/(75-81) 117/75 (01/06 0846) SpO2:  [94 %-96 %] 95 % (01/06 0846) Last BM Date : 07/21/23  Intake/Output from previous day: 01/05 0701 - 01/06 0700 In: 240 [P.O.:240] Out: 75 [Drains:75] Intake/Output this shift: No intake/output data recorded.  PE: General: resting comfortably, NAD Lungs: nonlabored respirations on room air Abd: soft, nondistended, nontender, incisions clean and dry with no erythema or induration. LUQ JP serosanguinous.  Psych: A&Ox3 with an appropriate affect.    Lab Results:  Recent Labs    07/23/23 0646 07/24/23 0518  WBC 19.7* 15.8*  HGB 12.0* 12.1*  HCT 36.7* 36.4*  PLT 271 238   BMET Recent Labs    07/23/23 0646 07/24/23 0518  NA 137 137  K 3.5 3.2*  CL 102 102  CO2 25 24  GLUCOSE 98 100*  BUN 8 8  CREATININE 0.76 0.73  CALCIUM  8.6* 8.4*   PT/INR No results for input(s): LABPROT, INR in the last 72 hours. 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 Send repeat level today. - Multimodal pain control: continue scheduled tylenol , increased scheduled robaxin  to 1000 mg q8h, continue oxycodone  5-10 mg q4h prn, IV dilaudid  for breakthrough  - Mobilize, IS, pulmonary toilet - VTE: Lovenox , SCDs   LOS: 3 days     Leonor LITTIE Dawn, MD [facility] Surgery 07/24/2023, 9:11 AM Please see Amion for pager number during day hours 7:00am-4:30pm

## 2023-07-24 NOTE — Plan of Care (Signed)
  Problem: Nutrition: Goal: Adequate nutrition will be maintained Outcome: Progressing   Problem: Pain Management: Goal: General experience of comfort will improve Outcome: Progressing   Problem: Safety: Goal: Ability to remain free from injury will improve Outcome: Progressing

## 2023-07-25 LAB — BPAM RBC
Blood Product Expiration Date: 202501302359
Blood Product Expiration Date: 202501302359
Unit Type and Rh: 5100
Unit Type and Rh: 5100

## 2023-07-25 LAB — TYPE AND SCREEN
ABO/RH(D): O POS
Antibody Screen: NEGATIVE
Unit division: 0
Unit division: 0

## 2023-07-25 LAB — AMYLASE, BODY FLUID (OTHER): Amylase, Body Fluid: 5284 U/L

## 2023-07-25 LAB — SURGICAL PATHOLOGY

## 2023-07-25 MED ORDER — LISINOPRIL-HYDROCHLOROTHIAZIDE 10-12.5 MG PO TABS: 1.0000 | ORAL_TABLET | Freq: Every day | ORAL | Status: DC

## 2023-07-25 MED ORDER — HYDROCHLOROTHIAZIDE 12.5 MG PO TABS
12.5000 mg | ORAL_TABLET | Freq: Every day | ORAL | Status: DC
  Administered 2023-07-25: 12.5 mg via ORAL
  Filled 2023-07-25: qty 1

## 2023-07-25 MED ORDER — LISINOPRIL 10 MG PO TABS
10.0000 mg | ORAL_TABLET | Freq: Every day | ORAL | Status: DC
  Administered 2023-07-25: 10 mg via ORAL
  Filled 2023-07-25: qty 1

## 2023-07-25 MED ORDER — OXYCODONE HCL 5 MG PO TABS: 5.0000 mg | ORAL_TABLET | Freq: Four times a day (QID) | ORAL | 0 refills | Status: AC | PRN

## 2023-07-25 NOTE — Progress Notes (Signed)
 Discharge instructions given. Patient verbalized understanding and all questions were answered.  ?

## 2023-07-25 NOTE — Progress Notes (Signed)
 Patient feels well this morning. Had a BM last night. Tolerating soft diet. Drain amylase pending. Will discharge home today with drain in place. Will contact patient when drain amylase results.

## 2023-07-25 NOTE — Discharge Summary (Signed)
 Physician Discharge Summary   Patient ID: Jesus Hamilton [id number] y.o. [birth year]  Admit date: 07/21/2023  Discharge date and time: 07/25/2023 10:37 AM   Admitting Physician: Leonor LITTIE Dawn, MD   Discharge Physician: Leonor Dawn, MD  Admission Diagnoses: Pancreatic cyst [K86.2]  Discharge Diagnoses: Mucinous cystic neoplasm of pancreas  Admission Condition: good  Discharged Condition: good  Indication for Admission: Jesus Hamilton is a 53 yo male with a cystic mass in the tail of the pancreas, which was diagnosed incidentally during a workup for prostate cancer. He ultimately had an EUS, and FNA with fluid analysis was consistent with a mucinous neoplasm. After a discussion of the risks and benefits of surgery, he agreed to proceed with distal pancreatectomy. Please see separately dictated H&P for further details.  Hospital Course: The patient was taken to the OR on 07/21/23 for a laparoscopic spleen-preserving distal pancreatectomy. Please see separately dictated op note for further details. Postoperatively he was admitted to the med-surg floor in stable condition. He was advanced to a clear liquid diet. Foley was removed on POD1 and he was able to void spontaneously. He had a ileus, which resolved by POD3. He was advanced to a soft diet, which he tolerated with no nausea or vomiting. He worked with physical therapy and was able to ambulate independently. On the morning of POD4, he was tolerating a diet, pain was controlled on oral medications, and he was having bowel function. He was examined and deemed appropriate for discharge home. Drain amylase was pending at the time of discharge, thus his surgical drain was left in place.  Consults: None  Significant Diagnostic Studies: N/A  Treatments: analgesia: acetaminophen , Dilaudid , and oxycodone , therapies: PT, and surgery: Laparoscopic distal pancreatectomy  Discharge Exam: General: resting comfortably, NAD Neuro: alert and oriented, no  focal deficits Resp: normal work of breathing on room air Abdomen: soft, nondistended, nontender to palpation. Incisions clean and dry with no erythema or induration. LUQ JP serosanguinous. Extremities: warm and well-perfused   Disposition: Discharge disposition: 01-Home or Self Care       Patient Instructions:  Allergies as of 07/25/2023   No Known Allergies      Medication List     TAKE these medications    acetaminophen  500 MG tablet Commonly known as: TYLENOL  Take 2 tablets (1,000 mg total) by mouth every 8 (eight) hours as needed for mild pain (pain score 1-3).   atorvastatin  10 MG tablet Commonly known as: LIPITOR Take 1 tablet by mouth once daily   docusate sodium  100 MG capsule Commonly known as: COLACE Take 1 capsule (100 mg total) by mouth 2 (two) times daily.   lisinopril -hydrochlorothiazide  10-12.5 MG tablet Commonly known as: ZESTORETIC  Take 1 tablet by mouth once daily   oxyCODONE  5 MG immediate release tablet Commonly known as: Oxy IR/ROXICODONE  Take 1 tablet (5 mg total) by mouth every 6 (six) hours as needed for up to 5 days (severe pain).   polyethylene glycol 17 g packet Commonly known as: MIRALAX  / GLYCOLAX  Take 17 g by mouth daily as needed (constipation).       Activity: no driving while on analgesics and no heavy lifting for 6 weeks Diet: regular diet Wound Care: keep wound clean and dry  Follow-up with Dr. Dawn on 08/28/23. Patient will be contacted for drain removal once drain amylase results.  Signed: Leonor LITTIE Dawn 07/25/2023 10:40 AM

## 2023-07-28 LAB — AMYLASE, BODY FLUID (OTHER)

## 2023-08-07 ENCOUNTER — Other Ambulatory Visit (INDEPENDENT_AMBULATORY_CARE_PROVIDER_SITE_OTHER): Payer: Self-pay | Admitting: Primary Care

## 2023-08-07 DIAGNOSIS — I10 Essential (primary) hypertension: Secondary | ICD-10-CM

## 2023-08-07 NOTE — Telephone Encounter (Signed)
Requested Prescriptions  Pending Prescriptions Disp Refills   lisinopril-hydrochlorothiazide (ZESTORETIC) 10-12.5 MG tablet [Pharmacy Med Name: Lisinopril-hydroCHLOROthiazide 10-12.5 MG Oral Tablet] 90 tablet 0    Sig: Take 1 tablet by mouth once daily     Cardiovascular:  ACEI + Diuretic Combos Failed - 08/07/2023  3:14 PM      Failed - K in normal range and within 180 days    Potassium  Date Value Ref Range Status  07/24/2023 3.2 (L) 3.5 - 5.1 mmol/L Final         Passed - Na in normal range and within 180 days    Sodium  Date Value Ref Range Status  07/24/2023 137 135 - 145 mmol/L Final  07/07/2023 142 134 - 144 mmol/L Final         Passed - Cr in normal range and within 180 days    Creatinine, Ser  Date Value Ref Range Status  07/24/2023 0.73 0.61 - 1.24 mg/dL Final         Passed - eGFR is 30 or above and within 180 days    GFR, Estimated  Date Value Ref Range Status  07/24/2023 >60 >60 mL/min Final    Comment:    (NOTE) Calculated using the CKD-EPI Creatinine Equation (2021)    eGFR  Date Value Ref Range Status  07/07/2023 107 >59 mL/min/1.73 Final         Passed - Patient is not pregnant      Passed - Last BP in normal range    BP Readings from Last 1 Encounters:  07/25/23 125/75         Passed - Valid encounter within last 6 months    Recent Outpatient Visits           1 month ago Essential hypertension   [place] [facility] [name], NP   7 months ago Essential hypertension   [place] [facility] [name], NP   1 year ago Essential hypertension   [place] [facility] [name], NP   1 year ago Prostate cancer [facility])   [place] [facility] [name], NP

## 2023-08-18 ENCOUNTER — Encounter (HOSPITAL_COMMUNITY): Payer: Self-pay

## 2023-08-18 ENCOUNTER — Other Ambulatory Visit: Payer: Self-pay

## 2023-08-18 ENCOUNTER — Emergency Department (HOSPITAL_COMMUNITY): Payer: Commercial Managed Care - HMO

## 2023-08-18 ENCOUNTER — Inpatient Hospital Stay (HOSPITAL_COMMUNITY)
Admission: EM | Admit: 2023-08-18 | Discharge: 2023-08-21 | DRG: 947 | Disposition: A | Discharge status: Home / Self Care | Payer: Commercial Managed Care - HMO | Source: Ambulatory Visit | Attending: Surgery | Admitting: Surgery

## 2023-08-18 DIAGNOSIS — I10 Essential (primary) hypertension: Secondary | ICD-10-CM | POA: Diagnosis present

## 2023-08-18 DIAGNOSIS — Z8546 Personal history of malignant neoplasm of prostate: Secondary | ICD-10-CM | POA: Diagnosis not present

## 2023-08-18 DIAGNOSIS — Z8616 Personal history of COVID-19: Secondary | ICD-10-CM

## 2023-08-18 DIAGNOSIS — E876 Hypokalemia: Secondary | ICD-10-CM | POA: Diagnosis present

## 2023-08-18 DIAGNOSIS — M25512 Pain in left shoulder: Secondary | ICD-10-CM | POA: Diagnosis present

## 2023-08-18 DIAGNOSIS — Z90411 Acquired partial absence of pancreas: Secondary | ICD-10-CM | POA: Diagnosis not present

## 2023-08-18 DIAGNOSIS — E785 Hyperlipidemia, unspecified: Secondary | ICD-10-CM | POA: Diagnosis present

## 2023-08-18 DIAGNOSIS — K8689 Other specified diseases of pancreas: Secondary | ICD-10-CM | POA: Diagnosis present

## 2023-08-18 DIAGNOSIS — K651 Peritoneal abscess: Secondary | ICD-10-CM | POA: Diagnosis present

## 2023-08-18 DIAGNOSIS — Z79899 Other long term (current) drug therapy: Secondary | ICD-10-CM | POA: Diagnosis not present

## 2023-08-18 DIAGNOSIS — G8918 Other acute postprocedural pain: Secondary | ICD-10-CM | POA: Diagnosis present

## 2023-08-18 DIAGNOSIS — R109 Unspecified abdominal pain: Secondary | ICD-10-CM | POA: Diagnosis present

## 2023-08-18 LAB — LACTIC ACID, PLASMA: Lactic Acid, Venous: 1.1 mmol/L (ref 0.5–1.9)

## 2023-08-18 LAB — CBC WITH DIFFERENTIAL/PLATELET
Abs Immature Granulocytes: 0.05 10*3/uL (ref 0.00–0.07)
Basophils Absolute: 0.1 10*3/uL (ref 0.0–0.1)
Basophils Relative: 0 %
Eosinophils Absolute: 0 10*3/uL (ref 0.0–0.5)
Eosinophils Relative: 0 %
HCT: 37.5 % — ABNORMAL LOW (ref 39.0–52.0)
Hemoglobin: 12 g/dL — ABNORMAL LOW (ref 13.0–17.0)
Immature Granulocytes: 0 %
Lymphocytes Relative: 7 %
Lymphs Abs: 0.8 10*3/uL (ref 0.7–4.0)
MCH: 27.5 pg (ref 26.0–34.0)
MCHC: 32 g/dL (ref 30.0–36.0)
MCV: 86 fL (ref 80.0–100.0)
Monocytes Absolute: 0.7 10*3/uL (ref 0.1–1.0)
Monocytes Relative: 6 %
Neutro Abs: 10.6 10*3/uL — ABNORMAL HIGH (ref 1.7–7.7)
Neutrophils Relative %: 87 %
Platelets: 409 10*3/uL — ABNORMAL HIGH (ref 150–400)
RBC: 4.36 MIL/uL (ref 4.22–5.81)
RDW: 13.4 % (ref 11.5–15.5)
WBC: 12.2 10*3/uL — ABNORMAL HIGH (ref 4.0–10.5)
nRBC: 0 % (ref 0.0–0.2)

## 2023-08-18 LAB — COMPREHENSIVE METABOLIC PANEL
ALT: 17 U/L (ref 0–44)
AST: 19 U/L (ref 15–41)
Albumin: 3.5 g/dL (ref 3.5–5.0)
Alkaline Phosphatase: 59 U/L (ref 38–126)
Anion gap: 15 (ref 5–15)
BUN: 15 mg/dL (ref 6–20)
CO2: 26 mmol/L (ref 22–32)
Calcium: 9.4 mg/dL (ref 8.9–10.3)
Chloride: 99 mmol/L (ref 98–111)
Creatinine, Ser: 0.94 mg/dL (ref 0.61–1.24)
GFR, Estimated: >60 mL/min (ref >60)
Glucose, Bld: 104 mg/dL — ABNORMAL HIGH (ref 70–99)
Potassium: 3.3 mmol/L — ABNORMAL LOW (ref 3.5–5.1)
Sodium: 140 mmol/L (ref 135–145)
Total Bilirubin: 0.8 mg/dL (ref 0.0–1.2)
Total Protein: 7.6 g/dL (ref 6.5–8.1)

## 2023-08-18 LAB — I-STAT CHEM 8, ED
BUN: 17 mg/dL (ref 6–20)
Calcium, Ion: 1.09 mmol/L — ABNORMAL LOW (ref 1.15–1.40)
Chloride: 100 mmol/L (ref 98–111)
Creatinine, Ser: 0.9 mg/dL (ref 0.61–1.24)
Glucose, Bld: 102 mg/dL — ABNORMAL HIGH (ref 70–99)
HCT: 38 % — ABNORMAL LOW (ref 39.0–52.0)
Hemoglobin: 12.9 g/dL — ABNORMAL LOW (ref 13.0–17.0)
Potassium: 3.3 mmol/L — ABNORMAL LOW (ref 3.5–5.1)
Sodium: 139 mmol/L (ref 135–145)
TCO2: 27 mmol/L (ref 22–32)

## 2023-08-18 LAB — LIPASE, BLOOD: Lipase: 28 U/L (ref 11–51)

## 2023-08-18 MED ORDER — ACETAMINOPHEN 500 MG PO TABS
1000.0000 mg | ORAL_TABLET | Freq: Once | ORAL | Status: AC
  Administered 2023-08-18: 1000 mg via ORAL
  Filled 2023-08-18: qty 2

## 2023-08-18 MED ORDER — IOHEXOL 350 MG/ML SOLN
75.0000 mL | Freq: Once | INTRAVENOUS | Status: AC | PRN
  Administered 2023-08-18: 75 mL via INTRAVENOUS

## 2023-08-18 NOTE — ED Provider Notes (Signed)
[place] EMERGENCY DEPARTMENT AT [facility] Provider Note   CSN: [id number] Arrival date & time: 08/18/23  1621     History  Chief Complaint  Patient presents with   Abdominal Pain   Post-op Problem         SHUBHAM THACKSTON is a 53 y.o. male.   Abdominal Pain Patient presents with abdominal pain.  Sent in from surgery follow-up.  Had distal pancreatectomy.  Has been having good relief after the surgery but with the drain pulled had more pain.  Has been severe.  No fevers.  Sent in for CT scan and further evaluation.  I had taken the call from Dr. Freida Busman earlier tonight.    Past Medical History:  Diagnosis Date   COVID-19 03/15/2021   GERD (gastroesophageal reflux disease)    Hyperlipidemia    Hypertension    Prostate cancer (HCC)     Home Medications Prior to Admission medications   Medication Sig Start Date End Date Taking? Authorizing Provider  acetaminophen (TYLENOL) 500 MG tablet Take 2 tablets (1,000 mg total) by mouth every 8 (eight) hours as needed for mild pain (pain score 1-3). 07/24/23   Fritzi Mandes, MD  atorvastatin (LIPITOR) 10 MG tablet Take 1 tablet by mouth once daily 06/27/23   [name], NP  docusate sodium (COLACE) 100 MG capsule Take 1 capsule (100 mg total) by mouth 2 (two) times daily. 07/24/23   Fritzi Mandes, MD  lisinopril-hydrochlorothiazide (ZESTORETIC) 10-12.5 MG tablet Take 1 tablet by mouth once daily 08/07/23   [name], NP  polyethylene glycol (MIRALAX / GLYCOLAX) 17 g packet Take 17 g by mouth daily as needed (constipation). 07/24/23   Fritzi Mandes, MD      Allergies    Patient has no known allergies.    Review of Systems   Review of Systems  Gastrointestinal:  Positive for abdominal pain.    Physical Exam Updated Vital Signs BP (!) 126/56 (BP Location: Right Arm)   Pulse 70   Temp 98.7 F (37.1 C) (Oral)   Resp 12   Ht 6' (1.829 m)   Wt 105.2 kg   SpO2 97%   BMI 31.46 kg/m  Physical  Exam Vitals and nursing note reviewed.  Cardiovascular:     Rate and Rhythm: Normal rate.  Abdominal:     Tenderness: There is abdominal tenderness.     Comments: Tenderness to left upper abdomen.  Skin:    Capillary Refill: Capillary refill takes less than 2 seconds.  Neurological:     Mental Status: He is alert.     ED Results / Procedures / Treatments   Labs (all labs ordered are listed, but only abnormal results are displayed) Labs Reviewed  COMPREHENSIVE METABOLIC PANEL - Abnormal; Notable for the following components:      Result Value   Potassium 3.3 (*)    Glucose, Bld 104 (*)    All other components within normal limits  CBC WITH DIFFERENTIAL/PLATELET - Abnormal; Notable for the following components:   WBC 12.2 (*)    Hemoglobin 12.0 (*)    HCT 37.5 (*)    Platelets 409 (*)    Neutro Abs 10.6 (*)    All other components within normal limits  I-STAT CHEM 8, ED - Abnormal; Notable for the following components:   Potassium 3.3 (*)    Glucose, Bld 102 (*)    Calcium, Ion 1.09 (*)    Hemoglobin 12.9 (*)  HCT 38.0 (*)    All other components within normal limits  LIPASE, BLOOD  LACTIC ACID, PLASMA  LACTIC ACID, PLASMA    EKG None  Radiology CT ABDOMEN PELVIS W CONTRAST Result Date: 08/18/2023 CLINICAL DATA:  Left upper quadrant pain EXAM: CT ABDOMEN AND PELVIS WITH CONTRAST TECHNIQUE: Multidetector CT imaging of the abdomen and pelvis was performed using the standard protocol following bolus administration of intravenous contrast. RADIATION DOSE REDUCTION: This exam was performed according to the departmental dose-optimization program which includes automated exposure control, adjustment of the mA and/or kV according to patient size and/or use of iterative reconstruction technique. CONTRAST:  75mL OMNIPAQUE IOHEXOL 350 MG/ML SOLN COMPARISON:  MRI 12/17/2022, CT 12/28/2020 FINDINGS: Lower chest: Lung bases demonstrate dependent atelectasis. Hepatobiliary: No focal  liver abnormality is seen. No gallstones, gallbladder wall thickening, or biliary dilatation. Pancreas: Interval distal pancreatectomy. Lobulated fluid collection extending from the pancreatic sutures to the left upper quadrant with dominant fluid collection in the spleen. Fluid collection measures approximately 7.5 by 5.5 cm on sagittal series 7, image 93 and 5.2 cm on series 3, image 10. Stranding within the left upper quadrant presumably due to residual postoperative change. Spleen: Fluid collection within the medial spleen, contiguous with fluid extending towards resection margin of distal pancreas. Adrenals/Urinary Tract: Adrenal glands are normal. Kidneys show no hydronephrosis. The bladder is unremarkable Stomach/Bowel: Mild asymmetrical wall thickening along the greater curvature of the stomach, probably reactive to adjacent postsurgical changes. Some fluid-filled jejunal loops in the left upper quadrant with minimal wall thickening but no obstruction. Negative appendix Vascular/Lymphatic: Nonaneurysmal aorta.  No suspicious lymph nodes. Reproductive: Prostatectomy.  No obvious mass Other: Some free fluid in the pelvis. No free air. Small fat containing umbilical hernia Musculoskeletal: No acute osseous abnormality IMPRESSION: 1. Interval distal pancreatectomy. Lobulated fluid collection extending from the pancreatic sutures to the left upper quadrant with dominant fluid collection in the spleen measuring up to 7.5 cm, sterility indeterminate this exam. Stranding within the left upper quadrant presumably representing residual postoperative change. 2. Some fluid-filled jejunal loops in the left upper quadrant with minimal wall thickening but no obstruction. Mild asymmetrical wall thickening along the greater curvature of the stomach, probably reactive to adjacent postsurgical changes. 3. Some free fluid in the pelvis. Electronically Signed   By: Jasmine Pang M.D.   On: 08/18/2023 21:18     Procedures Procedures    Medications Ordered in ED Medications  acetaminophen (TYLENOL) tablet 1,000 mg (1,000 mg Oral Given 08/18/23 1910)  iohexol (OMNIPAQUE) 350 MG/ML injection 75 mL (75 mLs Intravenous Contrast Given 08/18/23 2041)    ED Course/ Medical Decision Making/ A&P                                 Medical Decision Making Amount and/or Complexity of Data Reviewed Labs: ordered. Radiology: ordered.  Risk Prescription drug management.   Patient with abdominal pain.  Recent surgery.  Differential diagnosis includes abscess and bleed.  Continue tenderness.  Does have fluid collection in abdomen.  White count mildly elevated.  Does not appear overtly septic at this time however.  Discussed with Dr. Janee Morn from general surgery.  He will see patient and likely admit.        Final Clinical Impression(s) / ED Diagnoses Final diagnoses:  Post-operative pain    Rx / DC Orders ED Discharge Orders     None  Benjiman Core, MD 08/18/23 2303

## 2023-08-18 NOTE — ED Provider Triage Note (Cosign Needed)
Emergency Medicine Provider Triage Evaluation Note  Jesus Hamilton , a 53 y.o. male  was evaluated in triage.  Pt complains of abdominal pain.  Review of Systems  Positive:  Negative:   Physical Exam  BP 135/64 (BP Location: Right Arm)   Pulse (!) 106   Temp 98.7 F (37.1 C) (Oral)   Resp (!) 25   Ht 6' (1.829 m)   Wt 105.2 kg   SpO2 98%   BMI 31.46 kg/m  Gen:   Awake, no distress   Resp:  Normal effort  MSK:   Moves extremities without difficulty  Other:    Medical Decision Making  Medically screening exam initiated at 6:48 PM.  Appropriate orders placed.  Jesus Hamilton was informed that the remainder of the evaluation will be completed by another provider, this initial triage assessment does not replace that evaluation, and the importance of remaining in the ED until their evaluation is complete.  Lower abdominal pain after removal of drain over at surgery center. Patient was monitored but pain kept getting worse so the nurses wheeled him over here.   [name], [place] 08/18/23 [phone]

## 2023-08-18 NOTE — ED Triage Notes (Signed)
Pt to ED c/o lower abdominal pain that started today after removal of JP drain.  Jan 3 had pancreatic cyst removed and JP drain placed.

## 2023-08-18 NOTE — H&P (Signed)
Jesus Hamilton is an 53 y.o. male.   Chief Complaint: abdominal pain HPI: 53yo M S/P laparoscopic distal pancreatectomy by Dr. Freida Busman for [facility] on 07/21/2023.  Dr. Freida Busman removed his drain in the office today and he had abdominal pain after that.  It was persistent so he was referred to the emergency department.  Evaluation here included CT scan of the abdomen pelvis which demonstrates a fluid collection in his left upper quadrant.  His pain is improved somewhat.  I was asked to see him for admission and management.  Past Medical History:  Diagnosis Date   COVID-19 03/15/2021   GERD (gastroesophageal reflux disease)    Hyperlipidemia    Hypertension    Prostate cancer [facility])     Past Surgical History:  Procedure Laterality Date   BIOPSY  03/02/2023   Procedure: BIOPSY;  Surgeon: Lemar Lofty., MD;  Location: [facility];  Service: Gastroenterology;;   ESOPHAGOGASTRODUODENOSCOPY N/A 03/02/2023   Procedure: ESOPHAGOGASTRODUODENOSCOPY (EGD);  Surgeon: Lemar Lofty., MD;  Location: [facility];  Service: Gastroenterology;  Laterality: N/A;   EUS N/A 03/02/2023   Procedure: UPPER ENDOSCOPIC ULTRASOUND (EUS) RADIAL;  Surgeon: Lemar Lofty., MD;  Location: [facility];  Service: Gastroenterology;  Laterality: N/A;   FINE NEEDLE ASPIRATION N/A 03/02/2023   Procedure: FINE NEEDLE ASPIRATION (FNA) LINEAR;  Surgeon: Lemar Lofty., MD;  Location: [facility];  Service: Gastroenterology;  Laterality: N/A;   HAND SURGERY Right    PANCREATECTOMY N/A 07/21/2023   Procedure: LAPAROSCOPIC DISTAL PANCREATECTOMY;  Surgeon: Fritzi Mandes, MD;  Location: [facility] OR;  Service: General;  Laterality: N/A;   PELVIC LYMPH NODE DISSECTION Bilateral 04/21/2021   Procedure: BILATERAL PELVIC LYMPH NODE DISSECTION;  Surgeon: Crist Fat, MD;  Location: [facility];  Service: Urology;  Laterality: Bilateral;   PROSTATE BIOPSY     ROBOT ASSISTED LAPAROSCOPIC RADICAL PROSTATECTOMY N/A  04/21/2021   Procedure: XI ROBOTIC ASSISTED LAPAROSCOPIC RADICAL PROSTATECTOMY;  Surgeon: Crist Fat, MD;  Location: [facility];  Service: Urology;  Laterality: N/A;    Family History  Problem Relation Age of Onset   Colon cancer Neg Hx    Colon polyps Neg Hx    Esophageal cancer Neg Hx    Prostate cancer Neg Hx    Rectal cancer Neg Hx    Social History:  reports that he has never smoked. He has never used smokeless tobacco. He reports current alcohol use. He reports that he does not use drugs.  Allergies: No Known Allergies  (Not in a hospital admission)   Results for orders placed or performed during the hospital encounter of 08/18/23 (from the past 48 hours)  Lactic acid, plasma     Status: None   Collection Time: 08/18/23  6:52 PM  Result Value Ref Range   Lactic Acid, Venous 1.1 0.5 - 1.9 mmol/L    Comment: Performed at [facility], [address], [place], [place] [address]  Comprehensive metabolic panel     Status: Abnormal   Collection Time: 08/18/23  6:59 PM  Result Value Ref Range   Sodium 140 135 - 145 mmol/L   Potassium 3.3 (L) 3.5 - 5.1 mmol/L   Chloride 99 98 - 111 mmol/L   CO2 26 22 - 32 mmol/L   Glucose, Bld 104 (H) 70 - 99 mg/dL    Comment: Glucose reference range applies only to samples taken after fasting for at least 8 hours.   BUN 15 6 - 20 mg/dL  Creatinine, Ser 0.94 0.61 - 1.24 mg/dL   Calcium 9.4 8.9 - 62.1 mg/dL   Total Protein 7.6 6.5 - 8.1 g/dL   Albumin 3.5 3.5 - 5.0 g/dL   AST 19 15 - 41 U/L   ALT 17 0 - 44 U/L   Alkaline Phosphatase 59 38 - 126 U/L   Total Bilirubin 0.8 0.0 - 1.2 mg/dL   GFR, Estimated >30 >86 mL/min    Comment: (NOTE) Calculated using the CKD-EPI Creatinine Equation (2021)    Anion gap 15 5 - 15    Comment: Performed at [facility], [address], [place], [place] [address]  Lipase, blood     Status: None   Collection Time: 08/18/23  6:59 PM  Result Value Ref Range   Lipase 28 11 - 51 U/L     Comment: Performed at [facility], [address], [place], [place] [address]  CBC with Differential     Status: Abnormal   Collection Time: 08/18/23  6:59 PM  Result Value Ref Range   WBC 12.2 (H) 4.0 - 10.5 K/uL   RBC 4.36 4.22 - 5.81 MIL/uL   Hemoglobin 12.0 (L) 13.0 - 17.0 g/dL   HCT 28.4 (L) 13.2 - 44.0 %   MCV 86.0 80.0 - 100.0 fL   MCH 27.5 26.0 - 34.0 pg   MCHC 32.0 30.0 - 36.0 g/dL   RDW 10.2 72.5 - 36.6 %   Platelets 409 (H) 150 - 400 K/uL   nRBC 0.0 0.0 - 0.2 %   Neutrophils Relative % 87 %   Neutro Abs 10.6 (H) 1.7 - 7.7 K/uL   Lymphocytes Relative 7 %   Lymphs Abs 0.8 0.7 - 4.0 K/uL   Monocytes Relative 6 %   Monocytes Absolute 0.7 0.1 - 1.0 K/uL   Eosinophils Relative 0 %   Eosinophils Absolute 0.0 0.0 - 0.5 K/uL   Basophils Relative 0 %   Basophils Absolute 0.1 0.0 - 0.1 K/uL   Immature Granulocytes 0 %   Abs Immature Granulocytes 0.05 0.00 - 0.07 K/uL    Comment: Performed at [facility], [address], [place], [place] [address]  I-stat chem 8, ED (not at [facility] or [facility])     Status: Abnormal   Collection Time: 08/18/23  7:04 PM  Result Value Ref Range   Sodium 139 135 - 145 mmol/L   Potassium 3.3 (L) 3.5 - 5.1 mmol/L   Chloride 100 98 - 111 mmol/L   BUN 17 6 - 20 mg/dL   Creatinine, Ser 7.42 0.61 - 1.24 mg/dL   Glucose, Bld 595 (H) 70 - 99 mg/dL    Comment: Glucose reference range applies only to samples taken after fasting for at least 8 hours.   Calcium, Ion 1.09 (L) 1.15 - 1.40 mmol/L   TCO2 27 22 - 32 mmol/L   Hemoglobin 12.9 (L) 13.0 - 17.0 g/dL   HCT 63.8 (L) 75.6 - 43.3 %   CT ABDOMEN PELVIS W CONTRAST Result Date: 08/18/2023 CLINICAL DATA:  Left upper quadrant pain EXAM: CT ABDOMEN AND PELVIS WITH CONTRAST TECHNIQUE: Multidetector CT imaging of the abdomen and pelvis was performed using the standard protocol following bolus administration of intravenous contrast. RADIATION DOSE REDUCTION: This exam was performed according to the  departmental dose-optimization program which includes automated exposure control, adjustment of the mA and/or kV according to patient size and/or use of iterative reconstruction technique. CONTRAST:  75mL OMNIPAQUE IOHEXOL 350 MG/ML  SOLN COMPARISON:  MRI 12/17/2022, CT 12/28/2020 FINDINGS: Lower chest: Lung bases demonstrate dependent atelectasis. Hepatobiliary: No focal liver abnormality is seen. No gallstones, gallbladder wall thickening, or biliary dilatation. Pancreas: Interval distal pancreatectomy. Lobulated fluid collection extending from the pancreatic sutures to the left upper quadrant with dominant fluid collection in the spleen. Fluid collection measures approximately 7.5 by 5.5 cm on sagittal series 7, image 93 and 5.2 cm on series 3, image 10. Stranding within the left upper quadrant presumably due to residual postoperative change. Spleen: Fluid collection within the medial spleen, contiguous with fluid extending towards resection margin of distal pancreas. Adrenals/Urinary Tract: Adrenal glands are normal. Kidneys show no hydronephrosis. The bladder is unremarkable Stomach/Bowel: Mild asymmetrical wall thickening along the greater curvature of the stomach, probably reactive to adjacent postsurgical changes. Some fluid-filled jejunal loops in the left upper quadrant with minimal wall thickening but no obstruction. Negative appendix Vascular/Lymphatic: Nonaneurysmal aorta.  No suspicious lymph nodes. Reproductive: Prostatectomy.  No obvious mass Other: Some free fluid in the pelvis. No free air. Small fat containing umbilical hernia Musculoskeletal: No acute osseous abnormality IMPRESSION: 1. Interval distal pancreatectomy. Lobulated fluid collection extending from the pancreatic sutures to the left upper quadrant with dominant fluid collection in the spleen measuring up to 7.5 cm, sterility indeterminate this exam. Stranding within the left upper quadrant presumably representing residual postoperative  change. 2. Some fluid-filled jejunal loops in the left upper quadrant with minimal wall thickening but no obstruction. Mild asymmetrical wall thickening along the greater curvature of the stomach, probably reactive to adjacent postsurgical changes. 3. Some free fluid in the pelvis. Electronically Signed   By: Jasmine Pang M.D.   On: 08/18/2023 21:18    Review of Systems  Blood pressure (!) 126/56, pulse 70, temperature 98.7 F (37.1 C), temperature source Oral, resp. rate 12, height 6' (1.829 m), weight 105.2 kg, SpO2 97%. Physical Exam Constitutional:      Appearance: He is not ill-appearing.  Cardiovascular:     Rate and Rhythm: Normal rate and regular rhythm.  Pulmonary:     Effort: Pulmonary effort is normal.     Breath sounds: Normal breath sounds.  Abdominal:     General: Abdomen is flat. There is no distension.     Comments: Incisions are clean dry and intact.  Drain site is dressed.  He does not have any significant tenderness  Skin:    General: Skin is warm.  Neurological:     Mental Status: He is alert and oriented to person, place, and time.  Psychiatric:        Mood and Affect: Mood normal.      Assessment/Plan Status post laparoscopic distal pancreatectomy by Dr. Freida Busman for [facility] on 07/21/2023.  Underwent drain removal today in the office.  He had subsequent abdominal pain and evaluation in the emergency department showed a left upper quadrant fluid collection.  Plan will be for admission and pain control.  I will ask interventional radiology to evaluate this fluid collection for drainage.  We will keep him n.p.o. for possible procedure.  Liz Malady, MD 08/18/2023, 11:24 PM

## 2023-08-19 LAB — CBC
HCT: 33.3 % — ABNORMAL LOW (ref 39.0–52.0)
Hemoglobin: 10.8 g/dL — ABNORMAL LOW (ref 13.0–17.0)
MCH: 27.6 pg (ref 26.0–34.0)
MCHC: 32.4 g/dL (ref 30.0–36.0)
MCV: 85.2 fL (ref 80.0–100.0)
Platelets: 360 10*3/uL (ref 150–400)
RBC: 3.91 MIL/uL — ABNORMAL LOW (ref 4.22–5.81)
RDW: 13.6 % (ref 11.5–15.5)
WBC: 11.5 10*3/uL — ABNORMAL HIGH (ref 4.0–10.5)
nRBC: 0 % (ref 0.0–0.2)

## 2023-08-19 LAB — BASIC METABOLIC PANEL
Anion gap: 12 (ref 5–15)
BUN: 12 mg/dL (ref 6–20)
CO2: 25 mmol/L (ref 22–32)
Calcium: 8.8 mg/dL — ABNORMAL LOW (ref 8.9–10.3)
Chloride: 101 mmol/L (ref 98–111)
Creatinine, Ser: 0.88 mg/dL (ref 0.61–1.24)
GFR, Estimated: >60 mL/min (ref >60)
Glucose, Bld: 120 mg/dL — ABNORMAL HIGH (ref 70–99)
Potassium: 3.2 mmol/L — ABNORMAL LOW (ref 3.5–5.1)
Sodium: 138 mmol/L (ref 135–145)

## 2023-08-19 LAB — PROTIME-INR
INR: 1.3 — ABNORMAL HIGH (ref 0.8–1.2)
Prothrombin Time: 16 s — ABNORMAL HIGH (ref 11.4–15.2)

## 2023-08-19 LAB — LACTIC ACID, PLASMA: Lactic Acid, Venous: 0.8 mmol/L (ref 0.5–1.9)

## 2023-08-19 MED ORDER — DIPHENHYDRAMINE HCL 25 MG PO CAPS: 25.0000 mg | ORAL_CAPSULE | Freq: Four times a day (QID) | ORAL | Status: DC | PRN

## 2023-08-19 MED ORDER — DIPHENHYDRAMINE HCL 50 MG/ML IJ SOLN: 25.0000 mg | Freq: Four times a day (QID) | INTRAMUSCULAR | Status: DC | PRN

## 2023-08-19 MED ORDER — MORPHINE SULFATE (PF) 4 MG/ML IV SOLN
4.0000 mg | INTRAVENOUS | Status: DC | PRN
  Administered 2023-08-19: 4 mg via INTRAVENOUS
  Filled 2023-08-19 (×2): qty 1

## 2023-08-19 MED ORDER — KCL IN DEXTROSE-NACL 20-5-0.45 MEQ/L-%-% IV SOLN
INTRAVENOUS | Status: AC
  Filled 2023-08-19 (×3): qty 1000

## 2023-08-19 MED ORDER — OXYCODONE HCL 5 MG PO TABS
5.0000 mg | ORAL_TABLET | ORAL | Status: DC | PRN
  Administered 2023-08-19 – 2023-08-20 (×4): 10 mg via ORAL
  Filled 2023-08-19 (×4): qty 2

## 2023-08-19 MED ORDER — HYDROMORPHONE HCL 1 MG/ML IJ SOLN: 0.5000 mg | Freq: Once | INTRAMUSCULAR | Status: DC

## 2023-08-19 MED ORDER — ONDANSETRON HCL 4 MG/2ML IJ SOLN
4.0000 mg | Freq: Four times a day (QID) | INTRAMUSCULAR | Status: DC | PRN
  Administered 2023-08-19: 4 mg via INTRAVENOUS
  Filled 2023-08-19: qty 2

## 2023-08-19 MED ORDER — ONDANSETRON 4 MG PO TBDP: 4.0000 mg | ORAL_TABLET | Freq: Four times a day (QID) | ORAL | Status: DC | PRN

## 2023-08-19 NOTE — Progress Notes (Signed)
IR Procedure request - Intra abdominal abscess drain/aspiration   Per note from Dr. Violeta Gelinas dated 1.31.25 53 yo M S/P laparoscopic distal pancreatectomy by Dr. Freida Busman for [facility] on 07/21/2023.  Dr. Freida Busman removed his drain in the office today and he had abdominal pain after that.  It was persistent so he was referred to the emergency department. CT Abd from 1.31.25 reads . Interval distal pancreatectomy. Lobulated fluid collection extending from the pancreatic sutures to the left upper quadrant with dominant fluid collection in the spleen measuring up to 7.5 cm, sterility indeterminate this exam. Stranding within the left upper quadrant presumably representing residual postoperative change. Team is requesting intra abdominal abscess drain placement / aspiration.   After review of procedure request by IR Attending Dr. Richarda Overlie area in question is not drainable at this time. Should patient condition not improve or worsen recommend additional imaging and re-consult. This was communicated directly to the Team.  Please call IR with questions/concerns.

## 2023-08-19 NOTE — Progress Notes (Signed)
Subjective/Chief Complaint: Patient reports minimal abdominal pain now Most of pain is in his left shoulder   Objective: Vital signs in last 24 hours: Temp:  [98.6 F (37 C)-98.8 F (37.1 C)] 98.6 F (37 C) (02/01 0920) Pulse Rate:  [70-108] 97 (02/01 1000) Resp:  [12-32] 17 (02/01 1000) BP: (95-135)/(56-76) 118/69 (02/01 1000) SpO2:  [97 %-100 %] 99 % (02/01 1000) Weight:  [105.2 kg] 105.2 kg (01/31 1847)    Intake/Output from previous day: No intake/output data recorded. Intake/Output this shift: No intake/output data recorded.  Exam: Awake and alert Abdomen soft, minimally tender  Lab Results:  Recent Labs    08/18/23 1859 08/18/23 1904 08/19/23 0646  WBC 12.2*  --  11.5*  HGB 12.0* 12.9* 10.8*  HCT 37.5* 38.0* 33.3*  PLT 409*  --  360   BMET Recent Labs    08/18/23 1859 08/18/23 1904 08/19/23 0646  NA 140 139 138  K 3.3* 3.3* 3.2*  CL 99 100 101  CO2 26  --  25  GLUCOSE 104* 102* 120*  BUN 15 17 12   CREATININE 0.94 0.90 0.88  CALCIUM 9.4  --  8.8*   PT/INR Recent Labs    08/19/23 0646  LABPROT 16.0*  INR 1.3*   ABG No results for input(s): "PHART", "HCO3" in the last 72 hours.  Invalid input(s): "PCO2", "PO2"  Studies/Results: CT ABDOMEN PELVIS W CONTRAST Result Date: 08/18/2023 CLINICAL DATA:  Left upper quadrant pain EXAM: CT ABDOMEN AND PELVIS WITH CONTRAST TECHNIQUE: Multidetector CT imaging of the abdomen and pelvis was performed using the standard protocol following bolus administration of intravenous contrast. RADIATION DOSE REDUCTION: This exam was performed according to the departmental dose-optimization program which includes automated exposure control, adjustment of the mA and/or kV according to patient size and/or use of iterative reconstruction technique. CONTRAST:  75mL OMNIPAQUE IOHEXOL 350 MG/ML SOLN COMPARISON:  MRI 12/17/2022, CT 12/28/2020 FINDINGS: Lower chest: Lung bases demonstrate dependent atelectasis. Hepatobiliary:  No focal liver abnormality is seen. No gallstones, gallbladder wall thickening, or biliary dilatation. Pancreas: Interval distal pancreatectomy. Lobulated fluid collection extending from the pancreatic sutures to the left upper quadrant with dominant fluid collection in the spleen. Fluid collection measures approximately 7.5 by 5.5 cm on sagittal series 7, image 93 and 5.2 cm on series 3, image 10. Stranding within the left upper quadrant presumably due to residual postoperative change. Spleen: Fluid collection within the medial spleen, contiguous with fluid extending towards resection margin of distal pancreas. Adrenals/Urinary Tract: Adrenal glands are normal. Kidneys show no hydronephrosis. The bladder is unremarkable Stomach/Bowel: Mild asymmetrical wall thickening along the greater curvature of the stomach, probably reactive to adjacent postsurgical changes. Some fluid-filled jejunal loops in the left upper quadrant with minimal wall thickening but no obstruction. Negative appendix Vascular/Lymphatic: Nonaneurysmal aorta.  No suspicious lymph nodes. Reproductive: Prostatectomy.  No obvious mass Other: Some free fluid in the pelvis. No free air. Small fat containing umbilical hernia Musculoskeletal: No acute osseous abnormality IMPRESSION: 1. Interval distal pancreatectomy. Lobulated fluid collection extending from the pancreatic sutures to the left upper quadrant with dominant fluid collection in the spleen measuring up to 7.5 cm, sterility indeterminate this exam. Stranding within the left upper quadrant presumably representing residual postoperative change. 2. Some fluid-filled jejunal loops in the left upper quadrant with minimal wall thickening but no obstruction. Mild asymmetrical wall thickening along the greater curvature of the stomach, probably reactive to adjacent postsurgical changes. 3. Some free fluid in the pelvis. Electronically Signed  By: Jasmine Pang M.D.   On: 08/18/2023 21:18     Anti-infectives: Anti-infectives (From admission, onward)    None       Assessment/Plan: Status post laparoscopic distal pancreatectomy by Dr. Freida Busman for [facility] on 07/21/2023. Underwent drain removalin the office. He had subsequent abdominal pain and evaluation in the emergency department showed a left upper quadrant fluid collection   -IR has seen CT and the collection is currently not able to be drained -will allow clear liquids and follow clinically   Abigail Miyamoto 08/19/2023

## 2023-08-20 LAB — BASIC METABOLIC PANEL
Anion gap: 11 (ref 5–15)
BUN: 9 mg/dL (ref 6–20)
CO2: 25 mmol/L (ref 22–32)
Calcium: 8.7 mg/dL — ABNORMAL LOW (ref 8.9–10.3)
Chloride: 102 mmol/L (ref 98–111)
Creatinine, Ser: 0.82 mg/dL (ref 0.61–1.24)
GFR, Estimated: >60 mL/min (ref >60)
Glucose, Bld: 91 mg/dL (ref 70–99)
Potassium: 3.4 mmol/L — ABNORMAL LOW (ref 3.5–5.1)
Sodium: 138 mmol/L (ref 135–145)

## 2023-08-20 LAB — CBC
HCT: 35.6 % — ABNORMAL LOW (ref 39.0–52.0)
Hemoglobin: 11.5 g/dL — ABNORMAL LOW (ref 13.0–17.0)
MCH: 27.4 pg (ref 26.0–34.0)
MCHC: 32.3 g/dL (ref 30.0–36.0)
MCV: 85 fL (ref 80.0–100.0)
Platelets: 326 10*3/uL (ref 150–400)
RBC: 4.19 MIL/uL — ABNORMAL LOW (ref 4.22–5.81)
RDW: 13.3 % (ref 11.5–15.5)
WBC: 12.6 10*3/uL — ABNORMAL HIGH (ref 4.0–10.5)
nRBC: 0 % (ref 0.0–0.2)

## 2023-08-20 MED ORDER — ENOXAPARIN SODIUM 60 MG/0.6ML IJ SOSY
50.0000 mg | PREFILLED_SYRINGE | INTRAMUSCULAR | Status: DC
  Administered 2023-08-20: 50 mg via SUBCUTANEOUS
  Filled 2023-08-20 (×2): qty 0.6

## 2023-08-20 MED ORDER — POTASSIUM CHLORIDE CRYS ER 20 MEQ PO TBCR
40.0000 meq | EXTENDED_RELEASE_TABLET | Freq: Once | ORAL | Status: AC
  Administered 2023-08-20: 40 meq via ORAL
  Filled 2023-08-20: qty 2

## 2023-08-20 NOTE — Plan of Care (Signed)
   Problem: Education: Goal: Knowledge of General Education information will improve Description: Including pain rating scale, medication(s)/side effects and non-pharmacologic comfort measures Outcome: Progressing   Problem: Health Behavior/Discharge Planning: Goal: Ability to manage health-related needs will improve Outcome: Progressing   Problem: Activity: Goal: Risk for activity intolerance will decrease Outcome: Progressing   Problem: Nutrition: Goal: Adequate nutrition will be maintained Outcome: Progressing   Problem: Coping: Goal: Level of anxiety will decrease Outcome: Progressing   Problem: Pain Managment: Goal: General experience of comfort will improve and/or be controlled Outcome: Progressing   Problem: Safety: Goal: Ability to remain free from injury will improve Outcome: Progressing   Problem: Skin Integrity: Goal: Risk for impaired skin integrity will decrease Outcome: Progressing

## 2023-08-20 NOTE — Plan of Care (Signed)

## 2023-08-20 NOTE — Progress Notes (Signed)
Subjective: CC: Feeling much better today.  He is tolerating CLD without any abdominal pain, nausea or vomiting this am.  No shortness of breath.  Passing flatus.  No BM.  Voiding without issues.  Mobilizing in the halls.  Afebrile. No tachycardia or hypotension. WBC 12.6 from 11.5   Objective: Vital signs in last 24 hours: Temp:  [97.7 F (36.5 C)-99.6 F (37.6 C)] 98.9 F (37.2 C) (02/02 0806) Pulse Rate:  [92-101] 93 (02/02 0806) Resp:  [17-18] 17 (02/02 0806) BP: (100-124)/(61-79) 113/74 (02/02 0806) SpO2:  [93 %-99 %] 95 % (02/02 0806) Last BM Date : 08/17/23  Intake/Output from previous day: 02/01 0701 - 02/02 0700 In: 3496.5 [P.O.:1560; I.V.:1936.5] Out: -  Intake/Output this shift: No intake/output data recorded.  PE: Gen:  Alert, NAD, pleasant Card:  RRR Pulm:  CTAB, no W/R/R, effort normal Abd: Soft, ND, NT, +BS, incisions cdi Psych: A&Ox3   Lab Results:  Recent Labs    08/19/23 0646 08/20/23 0706  WBC 11.5* 12.6*  HGB 10.8* 11.5*  HCT 33.3* 35.6*  PLT 360 326   BMET Recent Labs    08/19/23 0646 08/20/23 0706  NA 138 138  K 3.2* 3.4*  CL 101 102  CO2 25 25  GLUCOSE 120* 91  BUN 12 9  CREATININE 0.88 0.82  CALCIUM 8.8* 8.7*   PT/INR Recent Labs    08/19/23 0646  LABPROT 16.0*  INR 1.3*   CMP     Component Value Date/Time   NA 138 08/20/2023 0706   NA 142 07/07/2023 0840   K 3.4 (L) 08/20/2023 0706   CL 102 08/20/2023 0706   CO2 25 08/20/2023 0706   GLUCOSE 91 08/20/2023 0706   BUN 9 08/20/2023 0706   BUN 15 07/07/2023 0840   CREATININE 0.82 08/20/2023 0706   CALCIUM 8.7 (L) 08/20/2023 0706   PROT 7.6 08/18/2023 1859   PROT 6.9 07/07/2023 0840   ALBUMIN 3.5 08/18/2023 1859   ALBUMIN 4.2 07/07/2023 0840   AST 19 08/18/2023 1859   ALT 17 08/18/2023 1859   ALKPHOS 59 08/18/2023 1859   BILITOT 0.8 08/18/2023 1859   BILITOT 0.6 07/07/2023 0840   GFRNONAA >60 08/20/2023 0706   Lipase     Component Value Date/Time    LIPASE 28 08/18/2023 1859    Studies/Results: CT ABDOMEN PELVIS W CONTRAST Result Date: 08/18/2023 CLINICAL DATA:  Left upper quadrant pain EXAM: CT ABDOMEN AND PELVIS WITH CONTRAST TECHNIQUE: Multidetector CT imaging of the abdomen and pelvis was performed using the standard protocol following bolus administration of intravenous contrast. RADIATION DOSE REDUCTION: This exam was performed according to the departmental dose-optimization program which includes automated exposure control, adjustment of the mA and/or kV according to patient size and/or use of iterative reconstruction technique. CONTRAST:  75mL OMNIPAQUE IOHEXOL 350 MG/ML SOLN COMPARISON:  MRI 12/17/2022, CT 12/28/2020 FINDINGS: Lower chest: Lung bases demonstrate dependent atelectasis. Hepatobiliary: No focal liver abnormality is seen. No gallstones, gallbladder wall thickening, or biliary dilatation. Pancreas: Interval distal pancreatectomy. Lobulated fluid collection extending from the pancreatic sutures to the left upper quadrant with dominant fluid collection in the spleen. Fluid collection measures approximately 7.5 by 5.5 cm on sagittal series 7, image 93 and 5.2 cm on series 3, image 10. Stranding within the left upper quadrant presumably due to residual postoperative change. Spleen: Fluid collection within the medial spleen, contiguous with fluid extending towards resection margin of distal pancreas. Adrenals/Urinary Tract: Adrenal glands are normal. Kidneys  show no hydronephrosis. The bladder is unremarkable Stomach/Bowel: Mild asymmetrical wall thickening along the greater curvature of the stomach, probably reactive to adjacent postsurgical changes. Some fluid-filled jejunal loops in the left upper quadrant with minimal wall thickening but no obstruction. Negative appendix Vascular/Lymphatic: Nonaneurysmal aorta.  No suspicious lymph nodes. Reproductive: Prostatectomy.  No obvious mass Other: Some free fluid in the pelvis. No free air.  Small fat containing umbilical hernia Musculoskeletal: No acute osseous abnormality IMPRESSION: 1. Interval distal pancreatectomy. Lobulated fluid collection extending from the pancreatic sutures to the left upper quadrant with dominant fluid collection in the spleen measuring up to 7.5 cm, sterility indeterminate this exam. Stranding within the left upper quadrant presumably representing residual postoperative change. 2. Some fluid-filled jejunal loops in the left upper quadrant with minimal wall thickening but no obstruction. Mild asymmetrical wall thickening along the greater curvature of the stomach, probably reactive to adjacent postsurgical changes. 3. Some free fluid in the pelvis. Electronically Signed   By: Jasmine Pang M.D.   On: 08/18/2023 21:18    Anti-infectives: Anti-infectives (From admission, onward)    None        Assessment/Plan Status post laparoscopic distal pancreatectomy by Dr. Freida Busman for [facility] on 07/21/2023. Underwent drain removalin the office. He had subsequent abdominal pain and evaluation in the emergency department showed a left upper quadrant fluid collection  - IR has seen CT and the collection is currently not able to be drained  - Adv to reg diet and monitor - Mobilize. Pulm toilet  FEN - Reg, replace K (hypokalemia - K 3.4) VTE - SCDs, lovenox  ID - None    LOS: 2 days    Jacinto Halim , [facility] 08/20/2023, 9:37 AM Please see Amion for pager number during day hours 7:00am-4:30pm

## 2023-08-21 LAB — BASIC METABOLIC PANEL
Anion gap: 10 (ref 5–15)
BUN: 9 mg/dL (ref 6–20)
CO2: 26 mmol/L (ref 22–32)
Calcium: 8.3 mg/dL — ABNORMAL LOW (ref 8.9–10.3)
Chloride: 100 mmol/L (ref 98–111)
Creatinine, Ser: 0.72 mg/dL (ref 0.61–1.24)
GFR, Estimated: >60 mL/min (ref >60)
Glucose, Bld: 90 mg/dL (ref 70–99)
Potassium: 3.9 mmol/L (ref 3.5–5.1)
Sodium: 136 mmol/L (ref 135–145)

## 2023-08-21 LAB — CBC
HCT: 34.1 % — ABNORMAL LOW (ref 39.0–52.0)
Hemoglobin: 10.8 g/dL — ABNORMAL LOW (ref 13.0–17.0)
MCH: 27.7 pg (ref 26.0–34.0)
MCHC: 31.7 g/dL (ref 30.0–36.0)
MCV: 87.4 fL (ref 80.0–100.0)
Platelets: 256 10*3/uL (ref 150–400)
RBC: 3.9 MIL/uL — ABNORMAL LOW (ref 4.22–5.81)
RDW: 13.3 % (ref 11.5–15.5)
WBC: 10.4 10*3/uL (ref 4.0–10.5)
nRBC: 0 % (ref 0.0–0.2)

## 2023-08-21 MED ORDER — METHOCARBAMOL 500 MG PO TABS: 500.0000 mg | ORAL_TABLET | Freq: Four times a day (QID) | ORAL | 0 refills | Status: AC | PRN

## 2023-08-21 NOTE — Progress Notes (Signed)
Patient feels better this morning. Still has intermittent left shoulder pain, but improving. Eating without difficulty and ambulating. Would like to go home. I reviewed his CT, which shows a small collection adjacent to the spleen, and some splenic infarct. It is unlikely this small collection caused sudden onset pain after drain removal, suspect a musculoskeletal source. Will discharge home today, anticipate pain will continue to improve. Recommended tylenol and robaxin for pain. Patient has follow up with me next week and was instructed to call if he has any fevers or worsening pain in the meantime.

## 2023-08-21 NOTE — Discharge Summary (Signed)
Physician Discharge Summary   Patient ID: Jesus Hamilton [id number] 52 y.o. 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 He had a postoperative pancreatic fistula, which was controlled with his surgical drain. He was seen in follow up in clinic on 1/31 for a drain check, at which time the drain output was very minimal and the drain was removed. After removal, he had sudden onset of severe acute abdominal pain and left shoulder pain. He was observed in the office for the afternoon and did not have improvement in severe pain, and was thus sent to the ED. A CT scan in the ED showed a small fluid collection between the spleen and the residual pancreas.  Hospital Course: The patient was admitted to the surgical service. The fluid collection was small and not amenable to drainage. The patient was treated symptomatically with pain control and symptoms slowly improved. His diet was advanced, which he was able to tolerate with no nausea or vomiting. On the morning of 2/3, his pain was significantly improved and he was afebrile and hemodynamically stable. WBC was normal. He was examined and deemed appropriate for discharge home, with follow up next week. He was given return precautions.  Consults: None  Significant Diagnostic Studies: radiology: CT scan: Small fluid collection adjacent to the spleen and extending toward the remnant pancreas  Treatments: analgesia: Morphine and oxycodone  Discharge Exam: General: resting  comfortably, NAD Neuro: alert and oriented, no focal deficits Resp: normal work of breathing on room air Abdomen: soft, nondistended, nontender to palpation. Incisions clean and dry, prior drain site clean with no leakage. Extremities: warm and well-perfused   Disposition: Discharge disposition: 01-Home or Self Care       Patient Instructions:  Allergies as of 08/21/2023   No Known Allergies      Medication List     TAKE these medications    acetaminophen 500 MG tablet Commonly known as: TYLENOL Take 2 tablets (1,000 mg total) by mouth every 8 (eight) hours as needed for mild pain (pain score 1-3).   atorvastatin 10 MG tablet Commonly known as: LIPITOR Take 1 tablet by mouth once daily   lisinopril-hydrochlorothiazide 10-12.5 MG tablet Commonly known as: ZESTORETIC Take 1 tablet by mouth once daily   methocarbamol 500 MG tablet Commonly known as: ROBAXIN Take 1 tablet (500 mg total) by mouth every 6 (six) hours as needed for up to 7 days for muscle spasms.       Activity: no driving while on analgesics and no heavy lifting for 3 weeks Diet: regular diet Wound Care: none needed  Follow-up with Dr. Freida Busman in 1 week.  Signed: Fritzi Mandes 08/21/2023 10:14 AM

## 2023-08-21 NOTE — Progress Notes (Signed)
DISCHARGE NOTE HOME HOANG [place]ILL to be discharged Home per MD order. Discussed prescriptions and follow up appointments with the patient. Prescriptions given to patient; medication list explained in detail. Patient verbalized understanding.  Skin clean, dry and intact without evidence of skin break down, no evidence of skin tears noted. IV catheter discontinued intact. Site without signs and symptoms of complications. Dressing and pressure applied. Pt denies pain at the site currently. No complaints noted.  Patient free of lines, drains, and wounds.   An After Visit Summary (AVS) was printed and given to the patient. Patient escorted via wheelchair, and discharged home via private auto.  Velia Meyer, RN

## 2023-08-21 NOTE — Plan of Care (Signed)
  Problem: Education: Goal: Knowledge of General Education information will improve Description: Including pain rating scale, medication(s)/side effects and non-pharmacologic comfort measures Outcome: Progressing   Problem: Clinical Measurements: Goal: Ability to maintain clinical measurements within normal limits will improve Outcome: Progressing   Problem: Activity: Goal: Risk for activity intolerance will decrease Outcome: Progressing   Problem: Nutrition: Goal: Adequate nutrition will be maintained Outcome: Progressing   Problem: Pain Managment: Goal: General experience of comfort will improve and/or be controlled Outcome: Progressing   Problem: Safety: Goal: Ability to remain free from injury will improve Outcome: Progressing   Problem: Skin Integrity: Goal: Risk for impaired skin integrity will decrease Outcome: Progressing   Problem: Education: Goal: Knowledge of General Education information will improve Description: Including pain rating scale, medication(s)/side effects and non-pharmacologic comfort measures Outcome: Progressing   Problem: Clinical Measurements: Goal: Ability to maintain clinical measurements within normal limits will improve Outcome: Progressing   Problem: Activity: Goal: Risk for activity intolerance will decrease Outcome: Progressing   Problem: Nutrition: Goal: Adequate nutrition will be maintained Outcome: Progressing   Problem: Pain Managment: Goal: General experience of comfort will improve and/or be controlled Outcome: Progressing   Problem: Safety: Goal: Ability to remain free from injury will improve Outcome: Progressing   Problem: Skin Integrity: Goal: Risk for impaired skin integrity will decrease Outcome: Progressing

## 2023-08-21 NOTE — Discharge Instructions (Addendum)
[facility] DISCHARGE INSTRUCTIONS  Activity No heavy lifting greater than 15 pounds for 3 more weeks.  Medications A  prescription for pain medication may be given to you upon discharge.  Take your pain medication as prescribed, if needed.  If narcotic pain medicine is not needed, then you may take acetaminophen (Tylenol) or ibuprofen (Advil) as needed. It is common to experience some constipation if taking pain medication after surgery.  Increasing fluid intake and taking a stool softener (such as Colace) will usually help or prevent this problem from occurring.  A mild laxative (Milk of Magnesia or Miralax) should be taken according to package directions if there are no bowel movements after 48 hours. Take your usually prescribed medications unless otherwise directed. If you need a refill on your pain medication, please contact your pharmacy.  They will contact our office to request authorization. Prescriptions will not be filled after 5 pm or on weekends.  When to Call us: Fever greater than 100.5 New redness, drainage, or swelling at incision site Severe pain, nausea, or vomiting Persistent bleeding from incisions   The clinic staff is available to answer your questions during regular business hours.  Please don't hesitate to call and ask to speak to one of the nurses for clinical concerns.  If you have a medical emergency, go to the nearest emergency room or call 911.  A surgeon from [facility] is always on call at the hospital  [address], [address], [place], [place]  [address] ?  [address] [address], [place], [place]   [address] [phone] ? Toll Free: [phone] ? FAX [phone] Web site: [email or web address]      Managing Your Pain After Surgery Without Opioids    Thank you for participating in our program to help patients manage their pain after surgery without opioids. This is part of our effort to provide you with the  best care possible, without exposing you or your family to the risk that opioids pose.  What pain can I expect after surgery? You can expect to have some pain after surgery. This is normal. The pain is typically worse the day after surgery, and quickly begins to get better. Many studies have found that many patients are able to manage their pain after surgery with Over-the-Counter (OTC) medications such as Tylenol and Motrin. If you have a condition that does not allow you to take Tylenol or Motrin, notify your surgical team.  How will I manage my pain? The best strategy for controlling your pain after surgery is around the clock pain control with Tylenol (acetaminophen) and Motrin (ibuprofen or Advil). Alternating these medications with each other allows you to maximize your pain control. In addition to Tylenol and Motrin, you can use heating pads or ice packs on your incisions to help reduce your pain.  How will I alternate your regular strength over-the-counter pain medication? You will take a dose of pain medication every three hours. Start by taking 650 mg of Tylenol (2 pills of 325 mg) 3 hours later take 600 mg of Motrin (3 pills of 200 mg) 3 hours after taking the Motrin take 650 mg of Tylenol 3 hours after that take 600 mg of Motrin.   - 1 -  See example - if your first dose of Tylenol is at 12:00 PM   12:00 PM Tylenol 650 mg (2 pills of 325 mg)  3:00 PM Motrin 600 mg (3 pills of 200 mg)  6:00 PM Tylenol  650 mg (2 pills of 325 mg)  9:00 PM Motrin 600 mg (3 pills of 200 mg)  Continue alternating every 3 hours   We recommend that you follow this schedule around-the-clock for at least 3 days after surgery, or until you feel that it is no longer needed. Use the table on the last page of this handout to keep track of the medications you are taking. Important: Do not take more than 3000mg  of Tylenol or 3200mg  of Motrin in a 24-hour period. Do not take ibuprofen/Motrin if you have a  history of bleeding stomach ulcers, severe kidney disease, &/or actively taking a blood thinner  What if I still have pain? If you have pain that is not controlled with the over-the-counter pain medications (Tylenol and Motrin or Advil) you might have what we call "breakthrough" pain. You will receive a prescription for a small amount of an opioid pain medication such as Oxycodone, Tramadol, or Tylenol with Codeine. Use these opioid pills in the first 24 hours after surgery if you have breakthrough pain. Do not take more than 1 pill every 4-6 hours.  If you still have uncontrolled pain after using all opioid pills, don't hesitate to call our staff using the number provided. We will help make sure you are managing your pain in the best way possible, and if necessary, we can provide a prescription for additional pain medication.   Day 1    Time  Name of Medication Number of pills taken  Amount of Acetaminophen  Pain Level   Comments  AM PM       AM PM       AM PM       AM PM       AM PM       AM PM       AM PM       AM PM       Total Daily amount of Acetaminophen Do not take more than  3,000 mg per day      Day 2    Time  Name of Medication Number of pills taken  Amount of Acetaminophen  Pain Level   Comments  AM PM       AM PM       AM PM       AM PM       AM PM       AM PM       AM PM       AM PM       Total Daily amount of Acetaminophen Do not take more than  3,000 mg per day      Day 3    Time  Name of Medication Number of pills taken  Amount of Acetaminophen  Pain Level   Comments  AM PM       AM PM       AM PM       AM PM         AM PM       AM PM       AM PM       AM PM       Total Daily amount of Acetaminophen Do not take more than  3,000 mg per day      Day 4    Time  Name of Medication Number of pills taken  Amount of Acetaminophen  Pain Level   Comments  AM PM  AM PM       AM PM       AM PM       AM PM       AM PM        AM PM       AM PM       Total Daily amount of Acetaminophen Do not take more than  3,000 mg per day      Day 5    Time  Name of Medication Number of pills taken  Amount of Acetaminophen  Pain Level   Comments  AM PM       AM PM       AM PM       AM PM       AM PM       AM PM       AM PM       AM PM       Total Daily amount of Acetaminophen Do not take more than  3,000 mg per day      Day 6    Time  Name of Medication Number of pills taken  Amount of Acetaminophen  Pain Level  Comments  AM PM       AM PM       AM PM       AM PM       AM PM       AM PM       AM PM       AM PM       Total Daily amount of Acetaminophen Do not take more than  3,000 mg per day      Day 7    Time  Name of Medication Number of pills taken  Amount of Acetaminophen  Pain Level   Comments  AM PM       AM PM       AM PM       AM PM       AM PM       AM PM       AM PM       AM PM       Total Daily amount of Acetaminophen Do not take more than  3,000 mg per day        For additional information about how and where to safely dispose of unused opioid medications - [email or web address]  Disclaimer: This document contains information and/or instructional materials adapted from [facility] for the typical patient with your condition. It does not replace medical advice from your health care provider because your experience may differ from that of the typical patient. Talk to your health care provider if you have any questions about this document, your condition or your treatment plan. Adapted from [facility]

## 2023-09-30 ENCOUNTER — Other Ambulatory Visit (INDEPENDENT_AMBULATORY_CARE_PROVIDER_SITE_OTHER): Payer: Self-pay | Admitting: Primary Care

## 2023-11-08 ENCOUNTER — Other Ambulatory Visit (INDEPENDENT_AMBULATORY_CARE_PROVIDER_SITE_OTHER): Payer: Self-pay | Admitting: Primary Care

## 2023-11-08 DIAGNOSIS — I10 Essential (primary) hypertension: Secondary | ICD-10-CM

## 2023-11-08 NOTE — Telephone Encounter (Signed)
 Will forward to provider

## 2024-01-01 ENCOUNTER — Other Ambulatory Visit (INDEPENDENT_AMBULATORY_CARE_PROVIDER_SITE_OTHER): Payer: Self-pay | Admitting: Primary Care

## 2024-01-01 ENCOUNTER — Other Ambulatory Visit (INDEPENDENT_AMBULATORY_CARE_PROVIDER_SITE_OTHER): Payer: Self-pay

## 2024-01-01 MED ORDER — ATORVASTATIN CALCIUM 10 MG PO TABS: 10.0000 mg | ORAL_TABLET | Freq: Every day | ORAL | 0 refills | Status: DC

## 2024-01-22 ENCOUNTER — Encounter (INDEPENDENT_AMBULATORY_CARE_PROVIDER_SITE_OTHER): Payer: Self-pay | Admitting: Primary Care

## 2024-01-22 ENCOUNTER — Other Ambulatory Visit (INDEPENDENT_AMBULATORY_CARE_PROVIDER_SITE_OTHER): Payer: Self-pay | Admitting: Primary Care

## 2024-01-22 DIAGNOSIS — I10 Essential (primary) hypertension: Secondary | ICD-10-CM

## 2024-01-23 ENCOUNTER — Other Ambulatory Visit (INDEPENDENT_AMBULATORY_CARE_PROVIDER_SITE_OTHER): Payer: Self-pay

## 2024-01-23 DIAGNOSIS — I10 Essential (primary) hypertension: Secondary | ICD-10-CM

## 2024-01-23 MED ORDER — LISINOPRIL-HYDROCHLOROTHIAZIDE 10-12.5 MG PO TABS
1.0000 | ORAL_TABLET | Freq: Every day | ORAL | 0 refills | Status: DC
Start: 2024-01-23 — End: 2024-02-09

## 2024-01-29 ENCOUNTER — Other Ambulatory Visit (INDEPENDENT_AMBULATORY_CARE_PROVIDER_SITE_OTHER): Payer: Self-pay | Admitting: Primary Care

## 2024-01-30 NOTE — Telephone Encounter (Signed)
 Requested Prescriptions  Pending Prescriptions Disp Refills   atorvastatin  (LIPITOR) 10 MG tablet [Pharmacy Med Name: Atorvastatin  Calcium  10 MG Oral Tablet] 90 tablet 0    Sig: Take 1 tablet by mouth once daily     Cardiovascular:  Antilipid - Statins Failed - 01/30/2024  2:52 PM      Failed - Lipid Panel in normal range within the last 12 months    Cholesterol, Total  Date Value Ref Range Status  07/07/2023 181 100 - 199 mg/dL Final   LDL Chol Calc (NIH)  Date Value Ref Range Status  07/07/2023 118 (H) 0 - 99 mg/dL Final   HDL  Date Value Ref Range Status  07/07/2023 52 >39 mg/dL Final   Triglycerides  Date Value Ref Range Status  07/07/2023 56 0 - 149 mg/dL Final         Passed - Patient is not pregnant      Passed - Valid encounter within last 12 months    Recent Outpatient Visits           6 months ago Essential hypertension   [place] [facility] Celestia Rosaline SQUIBB, NP   1 year ago Essential hypertension   [place] [facility] Celestia Rosaline SQUIBB, NP   1 year ago Essential hypertension   [place] [facility] Celestia Rosaline SQUIBB, NP   2 years ago Prostate cancer [facility])   [place] [facility] Celestia Rosaline SQUIBB, NP

## 2024-02-09 ENCOUNTER — Encounter (INDEPENDENT_AMBULATORY_CARE_PROVIDER_SITE_OTHER): Payer: Self-pay | Admitting: Primary Care

## 2024-02-09 ENCOUNTER — Ambulatory Visit (INDEPENDENT_AMBULATORY_CARE_PROVIDER_SITE_OTHER): Admitting: Primary Care

## 2024-02-09 VITALS — BP 121/76 | HR 67 | Wt 221.0 lb

## 2024-02-09 DIAGNOSIS — I10 Essential (primary) hypertension: Secondary | ICD-10-CM | POA: Diagnosis not present

## 2024-02-09 DIAGNOSIS — Z8546 Personal history of malignant neoplasm of prostate: Secondary | ICD-10-CM

## 2024-02-09 DIAGNOSIS — N529 Male erectile dysfunction, unspecified: Secondary | ICD-10-CM | POA: Diagnosis not present

## 2024-02-09 DIAGNOSIS — E663 Overweight: Secondary | ICD-10-CM

## 2024-02-09 DIAGNOSIS — E782 Mixed hyperlipidemia: Secondary | ICD-10-CM | POA: Diagnosis not present

## 2024-02-09 MED ORDER — TADALAFIL 10 MG PO TABS
10.0000 mg | ORAL_TABLET | ORAL | 1 refills | Status: DC | PRN
Start: 2024-02-09 — End: 2024-05-08

## 2024-02-09 MED ORDER — AMLODIPINE BESYLATE 10 MG PO TABS: 10.0000 mg | ORAL_TABLET | Freq: Every day | ORAL | 1 refills | Status: DC

## 2024-02-09 MED ORDER — VALSARTAN 40 MG PO TABS: 40.0000 mg | ORAL_TABLET | Freq: Every day | ORAL | 1 refills | Status: AC

## 2024-02-09 NOTE — Patient Instructions (Signed)
Valsartan Tablets What is this medication? VALSARTAN (val SAR tan) treats high blood pressure and heart failure. It may also be used to prevent further damage after a heart attack. It works by relaxing the blood vessels, which decreases blood pressure and the amount of work the heart has to do. It belongs to a group of medications called ARBs. This medicine may be used for other purposes; ask your health care provider or pharmacist if you have questions. COMMON BRAND NAME(S): Diovan What should I tell my care team before I take this medication? They need to know if you have any of these conditions: Heart failure Kidney disease Liver disease An unusual or allergic reaction to valsartan, other medications, foods, dyes, or preservatives Pregnant or trying to get pregnant Breast-feeding How should I use this medication? Take this medication by mouth. Take it as directed on the prescription label at the same time every day. You can take it with or without food. If it upsets your stomach, take it with food. Keep taking it unless your care team tells you to stop. Talk to your care team about the use of this medication in children. While it may be prescribed for children as young as 1 for selected conditions, precautions do apply. Overdosage: If you think you have taken too much of this medicine contact a poison control center or emergency room at once. NOTE: This medicine is only for you. Do not share this medicine with others. What if I miss a dose? If you miss a dose, take it as soon as you can. If it is almost time for your next dose, take only that dose. Do not take double or extra doses. What may interact with this medication? Aliskiren ACE inhibitors, such as enalapril or lisinopril Diuretics, especially amiloride, eplerenone, spironolactone, or triamterene Lithium NSAIDs, medications for pain and inflammation, such as ibuprofen or naproxen Potassium salts or potassium supplements This list  may not describe all possible interactions. Give your health care provider a list of all the medicines, herbs, non-prescription drugs, or dietary supplements you use. Also tell them if you smoke, drink alcohol, or use illegal drugs. Some items may interact with your medicine. What should I watch for while using this medication? Visit your care team for regular checks on your progress. Check your blood pressure as directed. Ask your care team what your blood pressure should be. Also, find out when you should contact him or her. Do not treat yourself for coughs, colds, or pain while you are taking this medication without asking your care team for advice. Some medications may increase your blood pressure. Women should inform their care team if they wish to become pregnant or think they might be pregnant. There is a potential for serious side effects to an unborn child. Talk to your care team for more information. This medication may affect your coordination, reaction time, or judgment. Do not drive or operate machinery until you know how this medication affects you. Sit up or stand slowly to reduce the risk of dizzy or fainting spells. Drinking alcohol with this medication can increase the risk of these side effects. Avoid salt substitutes unless you are told otherwise by your care team. What side effects may I notice from receiving this medication? Side effects that you should report to your care team as soon as possible: Allergic reactions--skin rash, itching, hives, swelling of the face, lips, tongue, or throat High potassium level--muscle weakness, fast or irregular heartbeat Kidney injury--decrease in the amount of   urine, swelling of the ankles, hands, or feet Low blood pressure--dizziness, feeling faint or lightheaded, blurry vision Side effects that usually do not require medical attention (report to your care team if they continue or are bothersome): Dizziness Fatigue Headache This list may not  describe all possible side effects. Call your doctor for medical advice about side effects. You may report side effects to FDA at 1-800-FDA-1088. Where should I keep my medication? Keep out of the reach of children and pets. Store at room temperature between 20 and 25 degrees C (68 and 77 degrees F). Protect from moisture. Keep the container tightly closed. Get rid of any unused medication after the expiration date. To get rid of medications that are no longer needed or have expired: Take the medication to a medication take-back program. Check with your pharmacy or law enforcement to find a location. If you cannot return the medication, check the label or package insert to see if the medication should be thrown out in the garbage or flushed down the toilet. If you are not sure, ask your care team. If it is safe to put it in the trash, take the medication out of the container. Mix the medication with cat litter, dirt, coffee grounds, or other unwanted substance. Seal the mixture in a bag or container. Put it in the trash. NOTE: This sheet is a summary. It may not cover all possible information. If you have questions about this medicine, talk to your doctor, pharmacist, or health care provider.  2024 Elsevier/Gold Standard (2021-06-04 00:00:00)

## 2024-02-09 NOTE — Progress Notes (Signed)
 Jesus Hamilton  Jesus Hamilton, is a 53 y.o. male  RDW:[id number]  FMW:[id number]  DOB - Jan 01, 1971  Chief Complaint  Patient presents with   Annual Exam    Wants to change bp medication to valsartan         Subjective:   Jesus Hamilton is a 53 y.o. male here today for a follow up visit for the management of high blood pressure. Patient has No headache, No chest pain, No abdominal pain - No Nausea, No new weakness tingling or numbness, No Cough - shortness of breath.  Patient also stated he had an allergic reaction to lisinopril  HCTZ will be discontinued and added to allergies Patient voices he would like to check PSA he has a history of prostate cancer.  Also requesting medication for ED.  No problems updated.  Comprehensive ROS Pertinent positive and negative noted in HPI   No Known Allergies  Past Medical History:  Diagnosis Date   COVID-19 03/15/2021   GERD (gastroesophageal reflux disease)    Hyperlipidemia    Hypertension    Prostate cancer (HCC)     Current Outpatient Medications on File Prior to Visit  Medication Sig Dispense Refill   acetaminophen  (TYLENOL ) 500 MG tablet Take 2 tablets (1,000 mg total) by mouth every 8 (eight) hours as needed for mild pain (pain score 1-3).     atorvastatin  (LIPITOR) 10 MG tablet Take 1 tablet by mouth once daily 90 tablet 0   No current facility-administered medications on file prior to visit.   Health Maintenance  Topic Date Due   Hepatitis B Vaccine (1 of 3 - 19+ 3-dose series) Never done   Zoster (Shingles) Vaccine (1 of 2) Never done   COVID-19 Vaccine (4 - 2024-25 season) 03/19/2023   Flu Shot  02/16/2024   DTaP/Tdap/Td vaccine (2 - Td or Tdap) 09/06/2024   Colon Cancer Screening  11/24/2032   Hepatitis C Screening  Completed   HIV Screening  Completed   HPV Vaccine  Aged Out   Meningitis B Vaccine  Aged Out    Objective:   Vitals:   02/09/24 0906  BP: 121/76  Pulse: 67  SpO2: 100%  Weight: 221 lb  (100.2 kg)   BP Readings from Last 3 Encounters:  02/09/24 121/76  08/21/23 110/65  07/25/23 125/75      Physical Exam Vitals reviewed.  Constitutional:      Appearance: Normal appearance.     Comments: Overweight BMI 29.97  HENT:     Head: Normocephalic.     Right Ear: Tympanic membrane, ear canal and external ear normal.     Left Ear: Tympanic membrane, ear canal and external ear normal.     Nose: Nose normal.  Eyes:     Extraocular Movements: Extraocular movements intact.     Pupils: Pupils are equal, round, and reactive to light.  Cardiovascular:     Rate and Rhythm: Normal rate and regular rhythm.  Pulmonary:     Effort: Pulmonary effort is normal.     Breath sounds: Normal breath sounds.  Abdominal:     General: Bowel sounds are normal. There is distension.     Palpations: Abdomen is soft.  Musculoskeletal:        General: Normal range of motion.  Skin:    General: Skin is warm and dry.  Neurological:     Mental Status: He is alert and oriented to person, place, and time.  Psychiatric:        Mood and  Affect: Mood normal.        Behavior: Behavior normal.        Thought Content: Thought content normal.        Judgment: Judgment normal.       Assessment & Plan  Jesus Hamilton was seen today for annual exam.  Diagnoses and all orders for this visit:  Essential hypertension Blood pressure was well-controlled on lisinopril /HCTZ however reported allergic reaction and changed to valsartan  40 mg daily DIET: Limit salt intake, read nutrition labels to check salt content, limit fried and high fatty foods  Avoid using multisymptom OTC cold preparations that generally contain sudafed which can rise BP. Consult with pharmacist on best cold relief products to use for persons with HTN EXERCISE Discussed incorporating exercise such as walking - 30 minutes most days of the week and can do in 10 minute intervals    -     CBC with Differential/Platelet -     CMP14+EGFR -      amLODipine  (NORVASC ) 10 MG tablet; Take 1 tablet (10 mg total) by mouth daily.  Mixed hyperlipidemia  Healthy lifestyle diet of fruits vegetables fish nuts whole grains and low saturated fat . Foods high in cholesterol or liver, fatty meats,cheese, butter avocados, nuts and seeds, chocolate and fried foods.  So ongoing your lipid panel with your annual PSA and then in   -     Lipid panel  History of prostate cancer -     PSA  Erectile dysfunction, unspecified erectile dysfunction type Unknown etiology  Other orders -     tadalafil  (CIALIS ) 10 MG tablet; Take 1 tablet (10 mg total) by mouth every other day as needed for erectile dysfunction.   Over weight  Discussed diet and exercise for person with BMI >25. Instructed: You must burn more calories than you eat. Losing 5 percent of your body weight should be considered a success. In the longer term, losing more than 15 percent of your body weight and staying at this weight is an extremely good result. However, keep in mind that even losing 5 percent of your body weight leads to important health benefits, so try not to get discouraged if you're not able to lose more than this. Exercising lost 20 lbs  Patient have been counseled extensively about nutrition and exercise. Other issues discussed during this visit include: low cholesterol diet, weight control and daily exercise, foot care, annual eye examinations at Ophthalmology, importance of adherence with medications and regular follow-up. We also discussed long term complications of uncontrolled diabetes and hypertension.   Return in about 4 weeks (around 03/08/2024) for re-check blood pressure.  The patient was given clear instructions to go to ER or return to medical center if symptoms don't improve, worsen or new problems develop. The patient verbalized understanding. The patient was told to call to get lab results if they haven't heard anything in the next week.   This note has been created  with Education officer, environmental. Any transcriptional errors are unintentional.   Rosaline SHAUNNA Bohr, NP 02/09/2024, 10:00 AM

## 2024-02-10 LAB — CMP14+EGFR
ALT: 16 IU/L (ref 0–44)
AST: 17 IU/L (ref 0–40)
Albumin: 4.4 g/dL (ref 3.8–4.9)
Alkaline Phosphatase: 66 IU/L (ref 44–121)
BUN/Creatinine Ratio: 20 (ref 9–20)
BUN: 17 mg/dL (ref 6–24)
Bilirubin Total: 0.5 mg/dL (ref 0.0–1.2)
CO2: 22 mmol/L (ref 20–29)
Calcium: 9.9 mg/dL (ref 8.7–10.2)
Chloride: 100 mmol/L (ref 96–106)
Creatinine, Ser: 0.83 mg/dL (ref 0.76–1.27)
Globulin, Total: 2.9 g/dL (ref 1.5–4.5)
Glucose: 79 mg/dL (ref 70–99)
Potassium: 4.5 mmol/L (ref 3.5–5.2)
Sodium: 140 mmol/L (ref 134–144)
Total Protein: 7.3 g/dL (ref 6.0–8.5)
eGFR: 105 mL/min/1.73 (ref >59)

## 2024-02-10 LAB — LIPID PANEL
Chol/HDL Ratio: 2.8 ratio (ref 0.0–5.0)
Cholesterol, Total: 164 mg/dL (ref 100–199)
HDL: 58 mg/dL (ref >39)
LDL Chol Calc (NIH): 95 mg/dL (ref 0–99)
Triglycerides: 56 mg/dL (ref 0–149)
VLDL Cholesterol Cal: 11 mg/dL (ref 5–40)

## 2024-02-10 LAB — CBC WITH DIFFERENTIAL/PLATELET
Basophils Absolute: 0 x10E3/uL (ref 0.0–0.2)
Basos: 0 %
EOS (ABSOLUTE): 0.2 x10E3/uL (ref 0.0–0.4)
Eos: 3 %
Hematocrit: 44.2 % (ref 37.5–51.0)
Hemoglobin: 13.8 g/dL (ref 13.0–17.7)
Immature Grans (Abs): 0 x10E3/uL (ref 0.0–0.1)
Immature Granulocytes: 0 %
Lymphocytes Absolute: 2.2 x10E3/uL (ref 0.7–3.1)
Lymphs: 25 %
MCH: 28 pg (ref 26.6–33.0)
MCHC: 31.2 g/dL — ABNORMAL LOW (ref 31.5–35.7)
MCV: 90 fL (ref 79–97)
Monocytes Absolute: 0.7 x10E3/uL (ref 0.1–0.9)
Monocytes: 8 %
Neutrophils Absolute: 5.8 x10E3/uL (ref 1.4–7.0)
Neutrophils: 64 %
Platelets: 327 x10E3/uL (ref 150–450)
RBC: 4.92 x10E6/uL (ref 4.14–5.80)
RDW: 14.5 % (ref 11.6–15.4)
WBC: 8.9 x10E3/uL (ref 3.4–10.8)

## 2024-02-10 LAB — PSA: Prostate Specific Ag, Serum: <0.1 ng/mL (ref 0.0–4.0)

## 2024-02-11 ENCOUNTER — Ambulatory Visit: Payer: Self-pay | Admitting: Primary Care

## 2024-02-19 ENCOUNTER — Other Ambulatory Visit (INDEPENDENT_AMBULATORY_CARE_PROVIDER_SITE_OTHER): Payer: Self-pay | Admitting: Primary Care

## 2024-02-19 ENCOUNTER — Encounter (INDEPENDENT_AMBULATORY_CARE_PROVIDER_SITE_OTHER): Payer: Self-pay

## 2024-02-19 DIAGNOSIS — I10 Essential (primary) hypertension: Secondary | ICD-10-CM

## 2024-04-02 ENCOUNTER — Telehealth (INDEPENDENT_AMBULATORY_CARE_PROVIDER_SITE_OTHER): Payer: Self-pay | Admitting: Primary Care

## 2024-04-02 NOTE — Telephone Encounter (Signed)
 Called pt to reschedule appt. Pt did not answer and could not LVM.

## 2024-04-05 ENCOUNTER — Ambulatory Visit (INDEPENDENT_AMBULATORY_CARE_PROVIDER_SITE_OTHER): Admitting: Primary Care

## 2024-04-26 ENCOUNTER — Ambulatory Visit (INDEPENDENT_AMBULATORY_CARE_PROVIDER_SITE_OTHER): Admitting: Primary Care

## 2024-04-28 ENCOUNTER — Other Ambulatory Visit (INDEPENDENT_AMBULATORY_CARE_PROVIDER_SITE_OTHER): Payer: Self-pay | Admitting: Primary Care

## 2024-04-30 NOTE — Telephone Encounter (Signed)
 Requested Prescriptions  Pending Prescriptions Disp Refills   atorvastatin  (LIPITOR) 10 MG tablet [Pharmacy Med Name: Atorvastatin  Calcium  10 MG Oral Tablet] 90 tablet 2    Sig: Take 1 tablet by mouth once daily     Cardiovascular:  Antilipid - Statins Failed - 04/30/2024  2:02 PM      Failed - Lipid Panel in normal range within the last 12 months    Cholesterol, Total  Date Value Ref Range Status  02/09/2024 164 100 - 199 mg/dL Final   LDL Chol Calc (NIH)  Date Value Ref Range Status  02/09/2024 95 0 - 99 mg/dL Final   HDL  Date Value Ref Range Status  02/09/2024 58 >39 mg/dL Final   Triglycerides  Date Value Ref Range Status  02/09/2024 56 0 - 149 mg/dL Final         Passed - Patient is not pregnant      Passed - Valid encounter within last 12 months    Recent Outpatient Visits           2 months ago Essential hypertension   [place] [facility] Celestia Rosaline SQUIBB, NP   9 months ago Essential hypertension   [place] [facility] Celestia Rosaline SQUIBB, NP   1 year ago Essential hypertension   [place] [facility] Celestia Rosaline SQUIBB, NP   1 year ago Essential hypertension   [place] [facility] Celestia Rosaline SQUIBB, NP   2 years ago Prostate cancer [facility])   [place] [facility] Celestia Rosaline SQUIBB, NP

## 2024-05-04 ENCOUNTER — Other Ambulatory Visit (INDEPENDENT_AMBULATORY_CARE_PROVIDER_SITE_OTHER): Payer: Self-pay | Admitting: Primary Care

## 2024-05-06 NOTE — Telephone Encounter (Signed)
 Will forward to provider

## 2024-05-08 ENCOUNTER — Ambulatory Visit (INDEPENDENT_AMBULATORY_CARE_PROVIDER_SITE_OTHER): Admitting: Primary Care

## 2024-05-31 ENCOUNTER — Encounter (INDEPENDENT_AMBULATORY_CARE_PROVIDER_SITE_OTHER): Payer: Self-pay | Admitting: Primary Care

## 2024-05-31 ENCOUNTER — Ambulatory Visit (INDEPENDENT_AMBULATORY_CARE_PROVIDER_SITE_OTHER): Admitting: Primary Care

## 2024-05-31 VITALS — BP 128/77 | HR 68 | Resp 16 | Wt 239.4 lb

## 2024-05-31 DIAGNOSIS — I10 Essential (primary) hypertension: Secondary | ICD-10-CM | POA: Diagnosis not present

## 2024-05-31 NOTE — Progress Notes (Signed)
 [facility]   Jesus Hamilton is a 53 y.o. male presents for hypertension evaluation, Denies shortness of breath, headaches, chest pain or lower extremity edema, sudden onset, vision changes, unilateral weakness, dizziness, paresthesias   Patient reports adherence with medications.  Past Medical History:  Diagnosis Date   COVID-19 03/15/2021   GERD (gastroesophageal reflux disease)    Hyperlipidemia    Hypertension    Prostate cancer [facility])    Past Surgical History:  Procedure Laterality Date   BIOPSY  03/02/2023   Procedure: BIOPSY;  Surgeon: Wilhelmenia Aloha Raddle., MD;  Location: [facility];  Service: Gastroenterology;;   ESOPHAGOGASTRODUODENOSCOPY N/A 03/02/2023   Procedure: ESOPHAGOGASTRODUODENOSCOPY (EGD);  Surgeon: Wilhelmenia Aloha Raddle., MD;  Location: [facility];  Service: Gastroenterology;  Laterality: N/A;   EUS N/A 03/02/2023   Procedure: UPPER ENDOSCOPIC ULTRASOUND (EUS) RADIAL;  Surgeon: Wilhelmenia Aloha Raddle., MD;  Location: [facility];  Service: Gastroenterology;  Laterality: N/A;   FINE NEEDLE ASPIRATION N/A 03/02/2023   Procedure: FINE NEEDLE ASPIRATION (FNA) LINEAR;  Surgeon: Wilhelmenia Aloha Raddle., MD;  Location: [facility];  Service: Gastroenterology;  Laterality: N/A;   HAND SURGERY Right    PANCREATECTOMY N/A 07/21/2023   Procedure: LAPAROSCOPIC DISTAL PANCREATECTOMY;  Surgeon: Dasie Leonor CROME, MD;  Location: [facility];  Service: General;  Laterality: N/A;   PELVIC LYMPH NODE DISSECTION Bilateral 04/21/2021   Procedure: BILATERAL PELVIC LYMPH NODE DISSECTION;  Surgeon: Cam Morene ORN, MD;  Location: [facility];  Service: Urology;  Laterality: Bilateral;   PROSTATE BIOPSY     ROBOT ASSISTED LAPAROSCOPIC RADICAL PROSTATECTOMY N/A 04/21/2021   Procedure: XI ROBOTIC ASSISTED LAPAROSCOPIC RADICAL PROSTATECTOMY;  Surgeon: Cam Morene ORN, MD;  Location: [facility];  Service: Urology;  Laterality: N/A;   Allergies  Allergen Reactions   Lisinopril       Pt had an allergic reaction to lisinopril -hctz   Current Outpatient Medications on File Prior to Visit  Medication Sig Dispense Refill   atorvastatin  (LIPITOR) 10 MG tablet Take 1 tablet by mouth once daily 90 tablet 2   tadalafil  (CIALIS ) 10 MG tablet TAKE 1 TABLET BY MOUTH EVERY OTHER DAY AS NEEDED FOR ERECTILE DYSFUNCTION 10 tablet 0   valsartan  (DIOVAN ) 40 MG tablet Take 1 tablet (40 mg total) by mouth daily. 90 tablet 1   acetaminophen  (TYLENOL ) 500 MG tablet Take 2 tablets (1,000 mg total) by mouth every 8 (eight) hours as needed for mild pain (pain score 1-3).     No current facility-administered medications on file prior to visit.   Social History   Socioeconomic History   Marital status: Divorced    Spouse name: Not on file   Number of children: Not on file   Years of education: Not on file   Highest education level: 12th grade  Occupational History   Not on file  Tobacco Use   Smoking status: Never   Smokeless tobacco: Never  Vaping Use   Vaping status: Never Used  Substance and Sexual Activity   Alcohol use: Yes    Comment: Once a month   Drug use: Never   Sexual activity: Not on file  Other Topics Concern   Not on file  Social History Narrative   Not on file   Social Drivers of Health   Financial Resource Strain: Low Risk  (05/30/2024)   Overall Financial Resource Strain (CARDIA)    Difficulty of Paying Living Expenses: Not hard at all  Food Insecurity: No Food Insecurity (05/30/2024)   Hunger Vital Sign  Worried About Programme Researcher, Broadcasting/film/video in the Last Year: Never true    Ran Out of Food in the Last Year: Never true  Transportation Needs: No Transportation Needs (05/30/2024)   PRAPARE - Administrator, Civil Service (Medical): No    Lack of Transportation (Non-Medical): No  Physical Activity: Sufficiently Active (05/30/2024)   Exercise Vital Sign    Days of Exercise per Week: 4 days    Minutes of Exercise per Session: 60 min  Stress: No  Stress Concern Present (05/30/2024)   [employer] of Occupational Health - Occupational Stress Questionnaire    Feeling of Stress: Not at all  Social Connections: Socially Isolated (05/30/2024)   Social Connection and Isolation Panel    Frequency of Communication with Friends and Family: Three times a week    Frequency of Social Gatherings with Friends and Family: Once a week    Attends Religious Services: Never    Database Administrator or Organizations: No    Attends Engineer, Structural: Not on file    Marital Status: Divorced  Intimate Partner Violence: Not At Risk (08/19/2023)   Humiliation, Afraid, Rape, and Kick questionnaire    Fear of Current or Ex-Partner: No    Emotionally Abused: No    Physically Abused: No    Sexually Abused: No   Family History  Problem Relation Age of Onset   Colon cancer Neg Hx    Colon polyps Neg Hx    Esophageal cancer Neg Hx    Prostate cancer Neg Hx    Rectal cancer Neg Hx    Health Maintenance  Topic Date Due   Hepatitis B Vaccines 19-59 Average Risk (1 of 3 - 19+ 3-dose series) Never done   COVID-19 Vaccine (4 - 2025-26 season) 03/18/2024   Zoster Vaccines- Shingrix (1 of 2) 08/31/2024 (Originally 11/07/1989)   Influenza Vaccine  10/15/2024 (Originally 02/16/2024)   Pneumococcal Vaccine: 50+ Years (1 of 1 - PCV) 05/31/2025 (Originally 11/07/2020)   DTaP/Tdap/Td (2 - Td or Tdap) 09/06/2024   Colonoscopy  11/24/2032   Hepatitis C Screening  Completed   HIV Screening  Completed   HPV VACCINES  Aged Out   Meningococcal B Vaccine  Aged Out     OBJECTIVE:  Vitals:   05/31/24 0944  BP: 128/77  Pulse: 68  Resp: 16  SpO2: 100%  Weight: 239 lb 6.4 oz (108.6 kg)    Physical Exam Vitals reviewed.  Constitutional:      Appearance: He is obese.  HENT:     Head: Normocephalic.     Right Ear: Tympanic membrane and external ear normal.     Left Ear: Tympanic membrane and external ear normal.     Nose: Nose normal.  Eyes:      Extraocular Movements: Extraocular movements intact.     Pupils: Pupils are equal, round, and reactive to light.  Cardiovascular:     Rate and Rhythm: Normal rate and regular rhythm.  Pulmonary:     Effort: Pulmonary effort is normal.     Breath sounds: Normal breath sounds.  Abdominal:     General: Bowel sounds are normal. There is distension.     Palpations: Abdomen is soft.  Musculoskeletal:        General: Normal range of motion.  Skin:    General: Skin is warm and dry.  Neurological:     Mental Status: He is oriented to person, place, and time.  Psychiatric:  Mood and Affect: Mood normal.        Behavior: Behavior normal.        Thought Content: Thought content normal.        Judgment: Judgment normal.      Review of Systems  All other systems reviewed and are negative.   Last 3 Office BP readings: BP Readings from Last 3 Encounters:  05/31/24 128/77  02/09/24 121/76  08/21/23 110/65    BMET    Component Value Date/Time   NA 140 02/09/2024 1006   K 4.5 02/09/2024 1006   CL 100 02/09/2024 1006   CO2 22 02/09/2024 1006   GLUCOSE 79 02/09/2024 1006   GLUCOSE 90 08/21/2023 0729   BUN 17 02/09/2024 1006   CREATININE 0.83 02/09/2024 1006   CALCIUM  9.9 02/09/2024 1006   GFRNONAA >60 08/21/2023 0729    Renal function: CrCl cannot be calculated (Patient's most recent lab result is older than the maximum 21 days allowed.).  Clinical ASCVD: No  The 10-year ASCVD risk score (Arnett DK, et al., 2019) is: 9.1%   Values used to calculate the score:     Age: 68 years     Clincally relevant sex: Male     Is Non-Hispanic African American: Yes     Diabetic: No     Tobacco smoker: No     Systolic Blood Pressure: 128 mmHg     Is BP treated: Yes     HDL Cholesterol: 58 mg/dL     Total Cholesterol: 164 mg/dL  ASCVD risk factors include- CHAD   ASSESSMENT & PLAN: Criss was seen today for hypertension.  Diagnoses and all orders for this  visit:  Essential hypertension -Counseled on lifestyle modifications for blood pressure control including reduced dietary sodium, increased exercise, weight reduction and adequate sleep. Also, educated patient about the risk for cardiovascular events, stroke and heart attack. Also counseled patient about the importance of medication adherence. If you participate in smoking, it is important to stop using tobacco as this will increase the risks associated with uncontrolled blood pressure.   Goal BP:  For patients younger than 60: Goal BP < 130/80. For patients 60 and older: Goal BP < 140/90. For patients with diabetes: Goal BP < 130/80. Your most recent BP: 128/77  Minimize salt intake. Minimize alcohol intake    This note has been created with Education officer, environmental. Any transcriptional errors are unintentional.   Rosaline SHAUNNA Bohr, NP 05/31/2024, 11:13 AM

## 2024-06-07 ENCOUNTER — Ambulatory Visit (INDEPENDENT_AMBULATORY_CARE_PROVIDER_SITE_OTHER): Admitting: Primary Care

## 2024-06-11 ENCOUNTER — Other Ambulatory Visit (INDEPENDENT_AMBULATORY_CARE_PROVIDER_SITE_OTHER): Payer: Self-pay | Admitting: Primary Care

## 2024-06-12 NOTE — Telephone Encounter (Signed)
 Requested Prescriptions  Pending Prescriptions Disp Refills   tadalafil  (CIALIS ) 10 MG tablet [Pharmacy Med Name: Tadalafil  10 MG Oral Tablet] 10 tablet 0    Sig: TAKE 1 TABLET BY MOUTH EVERY OTHER DAY AS NEEDED FOR ERECTILE DYSFUNCTION     Urology: Erectile Dysfunction Agents Passed - 06/12/2024 11:03 AM      Passed - AST in normal range and within 360 days    AST  Date Value Ref Range Status  02/09/2024 17 0 - 40 IU/L Final         Passed - ALT in normal range and within 360 days    ALT  Date Value Ref Range Status  02/09/2024 16 0 - 44 IU/L Final         Passed - Last BP in normal range    BP Readings from Last 1 Encounters:  05/31/24 128/77         Passed - Valid encounter within last 12 months    Recent Outpatient Visits           1 week ago Essential hypertension   [place] [facility] Celestia Rosaline SQUIBB, NP   4 months ago Essential hypertension   [place] [facility] Celestia Rosaline SQUIBB, NP   11 months ago Essential hypertension   [place] [facility] Celestia Rosaline SQUIBB, NP   1 year ago Essential hypertension   [place] [facility] Celestia Rosaline SQUIBB, NP   1 year ago Essential hypertension   [place] [facility] Celestia Rosaline SQUIBB, NP

## 2024-07-15 ENCOUNTER — Other Ambulatory Visit (INDEPENDENT_AMBULATORY_CARE_PROVIDER_SITE_OTHER): Payer: Self-pay | Admitting: Primary Care
